# Patient Record
Sex: Female | Born: 1975 | Race: White | Hispanic: No | Marital: Married | Smoking: Former smoker
Health system: Southern US, Community
[De-identification: ages and names within clinical notes are randomized; demographics above are authoritative.]

## PROBLEM LIST (undated history)

## (undated) DIAGNOSIS — G43709 Chronic migraine without aura, not intractable, without status migrainosus: Secondary | ICD-10-CM

## (undated) DIAGNOSIS — M792 Neuralgia and neuritis, unspecified: Secondary | ICD-10-CM

## (undated) DIAGNOSIS — R531 Weakness: Secondary | ICD-10-CM

## (undated) DIAGNOSIS — K644 Residual hemorrhoidal skin tags: Secondary | ICD-10-CM

## (undated) DIAGNOSIS — K219 Gastro-esophageal reflux disease without esophagitis: Secondary | ICD-10-CM

## (undated) DIAGNOSIS — K449 Diaphragmatic hernia without obstruction or gangrene: Secondary | ICD-10-CM

## (undated) DIAGNOSIS — R5383 Other fatigue: Secondary | ICD-10-CM

## (undated) DIAGNOSIS — J329 Chronic sinusitis, unspecified: Secondary | ICD-10-CM

## (undated) DIAGNOSIS — K648 Other hemorrhoids: Secondary | ICD-10-CM

## (undated) DIAGNOSIS — K2970 Gastritis, unspecified, without bleeding: Secondary | ICD-10-CM

## (undated) DIAGNOSIS — F419 Anxiety disorder, unspecified: Secondary | ICD-10-CM

## (undated) DIAGNOSIS — F3340 Major depressive disorder, recurrent, in remission, unspecified: Secondary | ICD-10-CM

## (undated) DIAGNOSIS — D649 Anemia, unspecified: Secondary | ICD-10-CM

## (undated) DIAGNOSIS — M542 Cervicalgia: Secondary | ICD-10-CM

## (undated) HISTORY — DX: Chronic migraine without aura, not intractable, without status migrainosus: G43.709

## (undated) HISTORY — DX: Gastritis, unspecified, without bleeding: K29.70

## (undated) HISTORY — DX: Weakness: R53.1

## (undated) HISTORY — DX: Other hemorrhoids: K64.8

## (undated) HISTORY — DX: Cervicalgia: M54.2

## (undated) HISTORY — DX: Residual hemorrhoidal skin tags: K64.4

## (undated) HISTORY — DX: Diaphragmatic hernia without obstruction or gangrene: K44.9

## (undated) HISTORY — DX: Anemia, unspecified: D64.9

## (undated) HISTORY — PX: DILATION AND CURETTAGE OF UTERUS: SHX78

## (undated) HISTORY — DX: Anxiety disorder, unspecified: F41.9

## (undated) HISTORY — DX: Other fatigue: R53.83

## (undated) HISTORY — PX: LAPAROSCOPY: SHX197

## (undated) HISTORY — PX: TONSILLECTOMY: SUR1361

## (undated) HISTORY — DX: Neuralgia and neuritis, unspecified: M79.2

## (undated) HISTORY — PX: NASAL SINUS SURGERY: SHX719

## (undated) HISTORY — DX: Major depressive disorder, recurrent, in remission, unspecified: F33.40

## (undated) HISTORY — DX: Gastro-esophageal reflux disease without esophagitis: K21.9

## (undated) HISTORY — DX: Chronic sinusitis, unspecified: J32.9

## (undated) HISTORY — PX: ENDOMETRIAL ABLATION: SHX621

## (undated) HISTORY — PX: TUBAL LIGATION: SHX77

## (undated) HISTORY — PX: ANAL SPHINCTEROTOMY: SHX1140

---

## 2003-09-18 ENCOUNTER — Other Ambulatory Visit: Admission: RE | Admit: 2003-09-18 | Discharge: 2003-09-18 | Payer: Self-pay | Admitting: Obstetrics and Gynecology

## 2004-08-08 ENCOUNTER — Ambulatory Visit (HOSPITAL_COMMUNITY): Admission: RE | Admit: 2004-08-08 | Discharge: 2004-08-08 | Payer: Self-pay | Admitting: Obstetrics and Gynecology

## 2004-11-29 ENCOUNTER — Other Ambulatory Visit: Admission: RE | Admit: 2004-11-29 | Discharge: 2004-11-29 | Payer: Self-pay | Admitting: Obstetrics and Gynecology

## 2004-11-30 ENCOUNTER — Ambulatory Visit (HOSPITAL_COMMUNITY): Admission: RE | Admit: 2004-11-30 | Discharge: 2004-11-30 | Payer: Self-pay | Admitting: Obstetrics and Gynecology

## 2006-06-25 ENCOUNTER — Emergency Department (HOSPITAL_COMMUNITY): Admission: EM | Admit: 2006-06-25 | Discharge: 2006-06-25 | Payer: Self-pay | Admitting: Emergency Medicine

## 2006-06-25 ENCOUNTER — Ambulatory Visit: Payer: Self-pay | Admitting: Vascular Surgery

## 2009-08-05 ENCOUNTER — Ambulatory Visit (HOSPITAL_COMMUNITY): Admission: RE | Admit: 2009-08-05 | Discharge: 2009-08-05 | Payer: Self-pay | Admitting: Obstetrics and Gynecology

## 2009-08-20 DEATH — deceased

## 2010-07-21 ENCOUNTER — Encounter (HOSPITAL_COMMUNITY)
Admission: RE | Admit: 2010-07-21 | Discharge: 2010-07-21 | Disposition: A | Discharge status: Home / Self Care | Payer: BC Managed Care – PPO | Source: Ambulatory Visit | Attending: Obstetrics and Gynecology | Admitting: Obstetrics and Gynecology

## 2010-07-21 LAB — RPR: RPR Ser Ql: NONREACTIVE

## 2010-07-21 LAB — CBC
MCH: 32.4 pg (ref 26.0–34.0)
Platelets: 156 10*3/uL (ref 150–400)

## 2010-07-21 LAB — SURGICAL PCR SCREEN: MRSA, PCR: NEGATIVE

## 2010-07-28 ENCOUNTER — Inpatient Hospital Stay (HOSPITAL_COMMUNITY)
Admission: RE | Admit: 2010-07-28 | Discharge: 2010-07-30 | DRG: 371 | Disposition: A | Discharge status: Home / Self Care | Payer: BC Managed Care – PPO | Source: Ambulatory Visit | Attending: Obstetrics and Gynecology | Admitting: Obstetrics and Gynecology

## 2010-07-28 ENCOUNTER — Other Ambulatory Visit: Payer: Self-pay | Admitting: Obstetrics and Gynecology

## 2010-07-28 ENCOUNTER — Inpatient Hospital Stay (HOSPITAL_COMMUNITY): Admission: RE | Admit: 2010-07-28 | Payer: Self-pay | Source: Ambulatory Visit | Admitting: Obstetrics and Gynecology

## 2010-07-28 DIAGNOSIS — Z01812 Encounter for preprocedural laboratory examination: Secondary | ICD-10-CM

## 2010-07-28 DIAGNOSIS — Z302 Encounter for sterilization: Secondary | ICD-10-CM

## 2010-07-28 DIAGNOSIS — Z01818 Encounter for other preprocedural examination: Secondary | ICD-10-CM

## 2010-07-28 DIAGNOSIS — O99892 Other specified diseases and conditions complicating childbirth: Principal | ICD-10-CM | POA: Diagnosis present

## 2010-07-29 LAB — CBC
Hemoglobin: 10.7 g/dL — ABNORMAL LOW (ref 12.0–15.0)
MCH: 31.8 pg (ref 26.0–34.0)
MCHC: 32.9 g/dL (ref 30.0–36.0)
MCV: 96.4 fL (ref 78.0–100.0)
Platelets: 165 10*3/uL (ref 150–400)
RDW: 13.1 % (ref 11.5–15.5)

## 2010-08-01 NOTE — Discharge Summary (Signed)
  [name], Amy Carney                ACCOUNT NO.:  [id number]  MEDICAL RECORD NO.:  [id number]           PATIENT TYPE:  I  LOCATION:  9146                          FACILITY:  [facility]  PHYSICIAN:  Ilda Mori, M.D.   DATE OF BIRTH:  [birth year]  DATE OF ADMISSION:  07/28/2010 DATE OF DISCHARGE:  07/30/2010                              DISCHARGE SUMMARY   FINAL DIAGNOSES: 1. Term pregnancy delivered, primary cesarean section. 2. Previous fourth-degree perineal laceration.  SECONDARY DIAGNOSIS:  None.  PROCEDURE:  Primary low transverse cesarean section.  COMPLICATIONS:  None.  CONDITION ON DISCHARGE:  Improved.  This is a 35 year old gravida 3, para 1-0-1-1 who was admitted at 39 weeks for an elective cesarean section.  The patient had a fourth-degree perineal laceration with her first vaginal delivery and elected to undergo cesarean section to prevent the potential for reinjuring the area.  The patient in addition wanted permanent sterilization.  The patient was brought to the operating by Dr. Carrington Clamp on the day of admission where a primary low transverse cesarean section and bilateral tubal sterilization were performed with the delivery of a female, 8 lbs. 5 ozs., Apgar 9/10.  There were no complications.  The patient's postoperative course was benign without significant fever or anemia.  On the second postoperative day, the patient was tolerating oral intake well.  Her pain was well controlled with ibuprofen, and she was afebrile, vital signs were stable, and she was felt to be ready for discharge.  She was discharged on a regular diet, told to limit activity.  She was asked to continue over-the-counter ibuprofen for pain.  She was given Percocet to take if the pain became more severe. She was also asked to take prenatal vitamins.  She was told to return to the office in 2 weeks for followup evaluation.  Laboratory data revealed an admission hemoglobin of 11.4, white  count of 9000.  Postoperatively, her hemoglobin was 10.7 with a white count of 8.8.     Ilda Mori, M.D.     RK/MEDQ  D:  07/30/2010  T:  07/30/2010  Job:  [id number]  Electronically Signed by Ilda Mori M.D. on 08/01/2010 12:11:19 PM

## 2010-08-14 LAB — CBC
HCT: 36.9 % (ref 36.0–46.0)
MCHC: 33.8 g/dL (ref 30.0–36.0)
MCV: 95.6 fL (ref 78.0–100.0)
RBC: 3.85 MIL/uL — ABNORMAL LOW (ref 3.87–5.11)
WBC: 4.4 10*3/uL (ref 4.0–10.5)

## 2010-08-16 NOTE — Op Note (Signed)
Amy Carney, ROBBEN NO.:  [id number]  MEDICAL RECORD NO.:  [id number]           PATIENT TYPE:  I  LOCATION:  9146                          FACILITY:  [facility]  PHYSICIAN:  Carrington Clamp, M.D. DATE OF BIRTH:  12-15-1975  DATE OF PROCEDURE:  07/28/2010 DATE OF DISCHARGE:                              OPERATIVE REPORT   PREOPERATIVE DIAGNOSES:  Previous vaginal delivery with a fourth degree and a postpartum revision and repair of fourth-degree tear, desires primary cesarean section to avoid further vaginal complications and multiparous, desires permanent sterility.  POSTOPERATIVE DIAGNOSES:  Previous vaginal delivery with a fourth degree and a postpartum revision and repair of fourth-degree tear, desires primary cesarean section to avoid further vaginal complications and multiparous, desires permanent sterility.  PROCEDURE:  Primary low transverse cesarean section with bilateral tubal ligation.  SURGEON:  Carrington Clamp, MD  ASSISTANT:  Luvenia Redden, MD  ANESTHESIA:  Spinal.  FINDINGS:  Girl infant, Apgars 9 and 10, weight 8 pounds 5 ounces, vertex presentation.  There were normal tubes, ovaries, uterus seen.  SPECIMENS:  Right and left tubal segments to pathology.  ESTIMATED BLOOD LOSS:  300 mL.  IV FLUIDS:  2000 mL.  URINE OUTPUT:  150 mL.  COMPLICATIONS:  None.  MEDICATIONS:  Ancef and Pitocin.  COUNTS:  Correct x3.  TECHNIQUE:  After adequate spinal anesthesia was achieved, the patient was prepped and draped in usual sterile fashion, dorsal supine position with leftward tilt.  A Pfannenstiel skin incision was made with a scalpel carried down to the fascia with Bovie cautery.  The fascia was incised in midline and with the scalpel and then carried in transverse curvilinear manner with the Mayo scissors.  The fascia was reflected superior and inferior from the rectus muscles and the rectus muscles were split in the midline.  The  bowel-free portion of peritoneum was then entered into and bluntly and the peritoneum opened in the superior inferior manner with good visualization of bowel and bladder.  The [employer] was then placed.  The vesicouterine fascia was then tented up and incised in a transverse curvilinear manner with the Metzenbaum scissors.  The bladder flap was retracted bluntly downward and out of the field of dissection.  A 2-cm incision was made in the upper portion of lower uterine segment until clear fluid was noted on to entry to the amnion.  The incision was extended in a transverse curvilinear manner bluntly.  The baby was identified in vertex presentation, delivered without complication.  The baby was bulb suctioned.  The cord was clamped and cut, and the baby was handed to awaiting pediatrics.  The placenta was then delivered manually and the uterus cleared of all debris with a wet lap.  The uterine incision then closed with a running lock stitch of zero Monocryl.  An imbricating layer of zero Monocryl was performed as well.  The uterine incision was hemostatic, and attention was then turned to the tubes, the left tube was tented up with a Babcock retractor.  A 2-cm incision was made in the avascular portion of the mesosalpinx.  Two freehand ties  were passed on each side to create an intervening segment of 2 cm of the tube.  Each was tied down.  The tubal segment was then excised and sent to pathology.  The same exact procedure was performed on the right-hand side and hemostasis was achieved.  The uterine incision was reinspected as well as the tubal ligation sites and everything was hemostatic.  Irrigation was performed and the [employer] was removed.  The peritoneum was then closed with running stitch of 2-0 Vicryl.  This incorporated the rectus muscles as separate layer.  The subcutaneous tissue was rendered hemostatic with Bovie cautery and irrigation.  The subcutaneous  tissue was closed with several interrupted stitches of 2-0 plain gut.  The skin was closed with staples.  The patient tolerated the procedure well and was returned to recovery stable condition.     Carrington Clamp, M.D.     MH/MEDQ  D:  07/28/2010  T:  07/29/2010  Job:  [id number]  Electronically Signed by Carrington Clamp MD on 08/16/2010 07:15:35 PM

## 2010-09-08 ENCOUNTER — Other Ambulatory Visit: Payer: Self-pay | Admitting: Obstetrics and Gynecology

## 2010-10-07 NOTE — Op Note (Signed)
Amy Carney, Amy Carney                ACCOUNT NO.:  [id number]   MEDICAL RECORD NO.:  [id number]          PATIENT TYPE:  AMB   LOCATION:  SDC                           [facility]ILITY:  [facility]   PHYSICIAN:  Carrington Clamp, M.D. DATE OF BIRTH:  10-Feb-1976   DATE OF PROCEDURE:  08/08/2004  DATE OF DISCHARGE:                                 OPERATIVE REPORT   PREOPERATIVE DIAGNOSIS:  Left lower quadrant pain.   POSTOPERATIVE DIAGNOSIS:  Adhesions.   PROCEDURE:  Diagnostic laparoscopy and lysis of adhesions.   SURGEON:  Carrington Clamp, M.D.   ASSISTANT:  None.   ANESTHESIA:  General.   SPECIMENS:  None.   ESTIMATED BLOOD LOSS:  None.   IV FLUIDS:  1000 mL.   URINE OUTPUT:  Not measured.   COMPLICATIONS:  None.   FINDINGS:  Normal pelvis and abdomen.  The uterus, although slightly boggy,  was normal in shape and appearance without fibroids.  The tubes, ovaries and  cul-de-sac were all clear of any adhesions or disease processes.  The  appendix was completely normal in appearance.  The liver edge and pelvic  peritoneum were completely normal as well.  There was no evidence of  hernias.  There was a minimal engorgement, injection of the pelvis on the  left-hand side.  There was an adhesion of the sigmoid to the anterior  abdominal wall close to the external iliac artery and vein.   MEDICATIONS:  Interceed.   COUNTS:  Correct x3.   TECHNIQUE:  After adequate general anesthesia was achieved, the patient was  prepped and draped in the usual sterile fashion in the dorsal lithotomy  position.  The speculum was placed in the vagina and a uterine manipulator  placed into the uterus.  The bladder was drained with a red rubber catheter  during the procedure.  Attention was then turned to the abdomen, where a 2  cm infraumbilical incision was made with a scalpel and the Veress needle  passed into the abdomen without aspiration of bowel contents or blood.  The  10 mm trocar was then placed  into the abdomen after the abdomen had been  insufflated and a 5 mm trocar placed suprapubically under direct  visualization of the camera.  The above findings were noted and a left-  handed peritoneal adhesion was tented up away from any of the bowel or  vessel structures and carefully cauterized.  Scissors were then used to take  down the cauterized portion of the adhesions.  Once the adhesion had been  cleared off the pelvic sidewall, a piece of Interceed was introduced and  placed over the denuded are to prevent re-adhesion.  All instruments were  then withdrawn from the abdomen, the abdomen desufflated.  The 10 mm trocar  site fascia was then closed with a figure-of-eight stitch of 2-0 Vicryl, the  5 mm skin site was closed with a superficial stitch of 3-0 Vicryl Rapide.  The skin incision was then closed with Dermabond.  All instruments were  withdrawn from the vagina.  The patient was returned to the recovery room in  stable condition.      MH/MEDQ  D:  08/08/2004  T:  08/08/2004  Job:  [id number]

## 2012-09-27 ENCOUNTER — Other Ambulatory Visit: Payer: Self-pay | Admitting: Obstetrics and Gynecology

## 2012-09-27 DIAGNOSIS — N644 Mastodynia: Secondary | ICD-10-CM

## 2012-10-10 ENCOUNTER — Ambulatory Visit
Admission: RE | Admit: 2012-10-10 | Discharge: 2012-10-10 | Disposition: A | Discharge status: Home / Self Care | Payer: BC Managed Care – PPO | Source: Ambulatory Visit | Attending: Obstetrics and Gynecology | Admitting: Obstetrics and Gynecology

## 2012-10-10 DIAGNOSIS — N644 Mastodynia: Secondary | ICD-10-CM

## 2015-11-02 ENCOUNTER — Encounter: Payer: Self-pay | Admitting: Internal Medicine

## 2015-11-02 NOTE — Telephone Encounter (Signed)
A user error has taken place.

## 2015-11-17 ENCOUNTER — Encounter: Payer: Self-pay | Admitting: Internal Medicine

## 2015-11-17 ENCOUNTER — Other Ambulatory Visit (INDEPENDENT_AMBULATORY_CARE_PROVIDER_SITE_OTHER): Payer: BLUE CROSS/BLUE SHIELD

## 2015-11-17 ENCOUNTER — Ambulatory Visit (INDEPENDENT_AMBULATORY_CARE_PROVIDER_SITE_OTHER): Payer: BLUE CROSS/BLUE SHIELD | Admitting: Internal Medicine

## 2015-11-17 VITALS — BP 108/66 | HR 62 | Ht 72.0 in | Wt 170.0 lb

## 2015-11-17 DIAGNOSIS — K219 Gastro-esophageal reflux disease without esophagitis: Secondary | ICD-10-CM

## 2015-11-17 DIAGNOSIS — R1013 Epigastric pain: Secondary | ICD-10-CM

## 2015-11-17 DIAGNOSIS — K625 Hemorrhage of anus and rectum: Secondary | ICD-10-CM | POA: Diagnosis not present

## 2015-11-17 LAB — IGA: IGA: 90 mg/dL (ref 68–378)

## 2015-11-17 MED ORDER — NA SULFATE-K SULFATE-MG SULF 17.5-3.13-1.6 GM/177ML PO SOLN: ORAL | Status: DC

## 2015-11-17 NOTE — Patient Instructions (Signed)
You have been scheduled for an endoscopy and colonoscopy. Please follow the written instructions given to you at your visit today. Please pick up your prep supplies at the pharmacy within the next 1-3 days. If you use inhalers (even only as needed), please bring them with you on the day of your procedure. Your physician has requested that you go to www.startemmi.com and enter the access code given to you at your visit today. This web site gives a general overview about your procedure. However, you should still follow specific instructions given to you by our office regarding your preparation for the procedure.  Your physician has requested that you go to the basement for the following lab work before leaving today: Celiac testing  If you are age 40 or older, your body mass index should be between 23-30. Your Body mass index is 23.05 kg/(m^2). If this is out of the aforementioned range listed, please consider follow up with your Primary Care Provider.  If you are age 40 or younger, your body mass index should be between 19-25. Your Body mass index is 23.05 kg/(m^2). If this is out of the aformentioned range listed, please consider follow up with your Primary Care Provider.

## 2015-11-17 NOTE — Progress Notes (Signed)
Patient ID: Amy Carney, female   DOB: 05/08/1976, 40 y.o.   MRN: [id number] HPI: Amy Carney is a 40 year old female with a past medical history of GERD who is seen to evaluate worsening GERD symptoms and intermittent rectal bleeding. She is here alone today. She reports a long-standing history of heartburn and reflux disease. She's been taking pantoprazole 40 mg daily. She's previously evaluated 5 years or longer ago by Dr. Chales AbrahamsGupta.  She had an upper endoscopy 5 or more years ago which was reportedly normal. She's been having epigastric abdominal pressure which can become quite severe. This can radiate up into her chest and throat. At times it radiates into her left chest. No specific trigger the food seems to trigger it at times. No nausea, vomiting or dysphagia. When the pressure is most intense it can last for days. She states she increases pantoprazole to 40 mg twice a day and add Zantac 150 mg 1-2 times a day to help "settle down" the symptoms. Over time this seems to help. Symptoms are not exertional and not associated with dyspnea. She reports bowel movements as regular though she has seen intermittent red blood in her stool and on the toilet tissue. This is in general painless. She denies significant constipation or diarrhea. She denies mid and lower abdominal pain. She does state that during vaginal birth of her first child in 2004 she had a perineal tear which extended near the anal sphincter. This required repair. Her second childbirth was through cesarean section. Family history notable for colon cancer in her maternal great-grandmother. She also has a paternal grandmother with Aron's esophagus. Other workup through primary care has included abdominal ultrasound and HIDA scan in August 2016 which were reportedly normal.  She has [facility] Sherol [facility]ade[facility]abrina Campbell[facility] [place] She sees Richardson DoppJoyce Patram-Brown, NP for primary care [facility].  Past  Medical History  Diagnosis Date  . GERD (gastroesophageal reflux disease)     Past Surgical History  Procedure Laterality Date  . Cesarean section    . Tonsillectomy    . Dilation and curettage of uterus    . Laparoscopy      No outpatient prescriptions prior to visit.   No facility-administered medications prior to visit.    Allergies  Allergen Reactions  . Penicillins Hives and Rash  . Sulfa Antibiotics Hives and Rash    Family History  Problem Relation Age of Onset  . Colon cancer      Great Maternal Grandmother  . Barrett's esophagus Paternal Grandmother     Social History  Substance Use Topics  . Smoking status: Former Games developermoker  . Smokeless tobacco: Never Used  . Alcohol Use: No    ROS: As per history of present illness, otherwise negative  BP 108/66 mmHg  Pulse 62  Ht 6' (1.829 m)  Wt 170 lb (77.111 kg)  BMI 23.05 kg/m2 Constitutional: Well-developed and well-nourished. No distress. HEENT: Normocephalic and atraumatic. Oropharynx is clear and moist. No oropharyngeal exudate. Conjunctivae are normal.  No scleral icterus. Neck: Neck supple. Trachea midline. Cardiovascular: Normal rate, regular rhythm and intact distal pulses. No M/R/G Pulmonary/chest: Effort normal and breath sounds normal. No wheezing, rales or rhonchi. Abdominal: Soft, Mild epigastric tenderness without rebound or guarding, nondistended. Bowel sounds active throughout. There are no masses palpable. No hepatosplenomegaly. Extremities: no clubbing, cyanosis, or edema Lymphadenopathy: No cervical adenopathy noted. Neurological: Alert and oriented to person place and time. Skin: Skin is warm  and dry. No rashes noted. Psychiatric: Normal mood and affect. Behavior is normal.  ASSESSMENT/PLAN: 40 year old female with a past medical history of GERD who is seen to evaluate worsening GERD symptoms and intermittent rectal bleeding.  1. GERD with epigastric pain -- her symptoms are consistent with  gastroesophageal reflux which are no longer controlled by once daily PPI. The epigastric pressure is new symptom for her. It seems to respond when she doubles the dose for PPI. We have discussed changing therapy to a longer acting PPI such as Dexilant. Given the change in her symptom I recommended repeat upper endoscopy. We discussed the risks, benefits and alternatives and she wishes to proceed. Check celiac panel today. I have advise that she continue pantoprazole 40 mg once daily for now and add ranitidine on a scheduled daily basis 150 mg in the evening.  2. Intermittent rectal bleeding -- colonoscopy recommended to evaluate further. We discussed the risks, benefits and alternatives and she wishes to proceed.

## 2015-11-18 LAB — TISSUE TRANSGLUTAMINASE, IGA: Tissue Transglutaminase Ab, IgA: 1 U/mL (ref <4)

## 2015-11-30 ENCOUNTER — Encounter: Payer: Self-pay | Admitting: Internal Medicine

## 2015-12-09 ENCOUNTER — Telehealth: Payer: Self-pay | Admitting: Internal Medicine

## 2015-12-09 ENCOUNTER — Encounter: Payer: Self-pay | Admitting: Internal Medicine

## 2015-12-09 ENCOUNTER — Ambulatory Visit (AMBULATORY_SURGERY_CENTER): Payer: BLUE CROSS/BLUE SHIELD | Admitting: Internal Medicine

## 2015-12-09 VITALS — BP 116/67 | HR 63 | Temp 97.8°F | Resp 12 | Ht 72.0 in | Wt 170.0 lb

## 2015-12-09 DIAGNOSIS — K219 Gastro-esophageal reflux disease without esophagitis: Secondary | ICD-10-CM

## 2015-12-09 DIAGNOSIS — R1013 Epigastric pain: Secondary | ICD-10-CM

## 2015-12-09 DIAGNOSIS — K625 Hemorrhage of anus and rectum: Secondary | ICD-10-CM | POA: Diagnosis not present

## 2015-12-09 DIAGNOSIS — K2950 Unspecified chronic gastritis without bleeding: Secondary | ICD-10-CM | POA: Diagnosis not present

## 2015-12-09 MED ORDER — SODIUM CHLORIDE 0.9 % IV SOLN: 500.0000 mL | INTRAVENOUS | Status: DC

## 2015-12-09 NOTE — Progress Notes (Signed)
Report to PACU, RN, vss, BBS= Clear.  

## 2015-12-09 NOTE — Op Note (Signed)
Amy Carney Procedure Date: 12/09/2015 1:51 PM MRN: [id number] Endoscopist: Beverley FiedlerJay M Tezra Mahr , MD Age: 40 Referring MD:  Date of Birth: 09/20/1975 Gender: Female Account #: [id number] Procedure:                Colonoscopy Indications:              Rectal bleeding Medicines:                Monitored Anesthesia Care Procedure:                Pre-Anesthesia Assessment:                           - Prior to the procedure, a History and Physical                            was performed, and patient medications and                            allergies were reviewed. The patient's tolerance of                            previous anesthesia was also reviewed. The risks                            and benefits of the procedure and the sedation                            options and risks were discussed with the patient.                            All questions were answered, and informed consent                            was obtained. Prior Anticoagulants: The patient has                            taken no previous anticoagulant or antiplatelet                            agents. ASA Grade Assessment: II - A patient with                            mild systemic disease. After reviewing the risks                            and benefits, the patient was deemed in                            satisfactory condition to undergo the procedure.                           After obtaining informed consent, the colonoscope  was passed under direct vision. Throughout the                            procedure, the patient's blood pressure, pulse, and                            oxygen saturations were monitored continuously. The                            Model PCF-H190DL [id number]) scope was introduced                            through the anus and advanced to the the cecum,                            identified by appendiceal orifice and ileocecal                             valve. The colonoscopy was performed without                            difficulty. The patient tolerated the procedure                            well. The quality of the bowel preparation was                            excellent. The ileocecal valve, appendiceal                            orifice, and rectum were photographed. Scope In: 2:08:32 PM Scope Out: 2:20:07 PM Scope Withdrawal Time: 0 hours 8 minutes 59 seconds  Total Procedure Duration: 0 hours 11 minutes 35 seconds  Findings:                 The perianal exam findings include a skin tag.                           The colon (entire examined portion) appeared normal.                           Internal hemorrhoids were found during                            retroflexion. The hemorrhoids were small. Complications:            No immediate complications. Estimated Blood Loss:     Estimated blood loss: none. Impression:               - Perianal skin tag found on perianal exam.                           - The entire examined colon is normal.                           -  Small internal hemorrhoids.                           - No specimens collected. Recommendation:           - Patient has a contact number available for                            emergencies. The signs and symptoms of potential                            delayed complications were discussed with the                            patient. Return to normal activities tomorrow.                            Written discharge instructions were provided to the                            patient.                           - Resume previous diet.                           - Continue present medications.                           - Repeat colonoscopy at age 58 for screening                            purposes.                           - If recurrent rectal bleeding on a regular basis                            hemorrhoidal banding would be a treatment  option. Beverley Fiedler, MD 12/09/2015 2:29:43 PM This report has been signed electronically. CC Letter to:             Richardson Dopp, NP

## 2015-12-09 NOTE — Telephone Encounter (Signed)
Returned patient phone call. She is scheduled for colon at 2 pm today and wanted to know if she could take Tylenol Sinus Cold and nasal spray for sinus headache. Denies fever. Advised okay to take as long as in by 11 am.

## 2015-12-09 NOTE — Progress Notes (Signed)
Called to room to assist during endoscopic procedure.  Patient ID and intended procedure confirmed with present staff. Received instructions for my participation in the procedure from the performing physician.  

## 2015-12-09 NOTE — Op Note (Signed)
[facility] Patient Name: Amy DaftJenny Carney Procedure Date: 12/09/2015 1:52 PM MRN: [id number] Endoscopist: Beverley FiedlerJay M Shakiah Wester , MD Age: [age] Referring MD:  Date of Birth: 02/20/1976 Gender: Female Account #: [id number] Procedure:                Upper GI endoscopy Indications:              Epigastric abdominal pain, Gastro-esophageal reflux                            disease Medicines:                Monitored Anesthesia Care Procedure:                Pre-Anesthesia Assessment:                           - Prior to the procedure, a History and Physical                            was performed, and patient medications and                            allergies were reviewed. The patient's tolerance of                            previous anesthesia was also reviewed. The risks                            and benefits of the procedure and the sedation                            options and risks were discussed with the patient.                            All questions were answered, and informed consent                            was obtained. Prior Anticoagulants: The patient has                            taken no previous anticoagulant or antiplatelet                            agents. ASA Grade Assessment: II - A patient with                            mild systemic disease. After reviewing the risks                            and benefits, the patient was deemed in                            satisfactory condition to undergo the procedure.  After obtaining informed consent, the endoscope was                            passed under direct vision. Throughout the                            procedure, the patient's blood pressure, pulse, and                            oxygen saturations were monitored continuously. The                            Model GIF-[id number]) scope was introduced                            through the mouth, and advanced to the second  part                            of duodenum. The upper GI endoscopy was                            accomplished without difficulty. The patient                            tolerated the procedure well. Scope In: Scope Out: Findings:                 The examined esophagus was normal.                           A small hiatal hernia was present on retroflexed                            views in the cardia/fundus.                           Localized mild inflammation characterized by                            erythema was found on the greater curvature of the                            gastric body. Biopsies were taken with a cold                            forceps for histology and Helicobacter pylori                            testing (body, antrum, and incisura including                            erythematous mucosa).                           The examined duodenum was normal. Complications:  No immediate complications. Estimated Blood Loss:     Estimated blood loss was minimal. Impression:               - Normal esophagus.                           - Small hiatal hernia.                           - Gastritis. Biopsied.                           - Normal examined duodenum. Recommendation:           - Patient has a contact number available for                            emergencies. The signs and symptoms of potential                            delayed complications were discussed with the                            patient. Return to normal activities tomorrow.                            Written discharge instructions were provided to the                            patient.                           - Resume previous diet.                           - Continue present medications.                           - Await pathology results.                           - GERD diet and reflux precautions Beverley Fiedler, MD 12/09/2015 2:25:30 PM This report has been signed  electronically. CC Letter to:             Richardson Dopp, NP

## 2015-12-09 NOTE — Patient Instructions (Signed)

## 2015-12-10 ENCOUNTER — Telehealth: Payer: Self-pay | Admitting: *Deleted

## 2015-12-10 NOTE — Telephone Encounter (Signed)
  Follow up Call-  Call back number 12/09/2015  Post procedure Call Back phone  # [phone]  Permission to leave phone message Yes     Patient questions:  Message left to call us if necessary.

## 2015-12-15 ENCOUNTER — Encounter: Payer: Self-pay | Admitting: Internal Medicine

## 2016-02-01 ENCOUNTER — Encounter: Payer: Self-pay | Admitting: *Deleted

## 2016-02-03 ENCOUNTER — Other Ambulatory Visit: Payer: Self-pay | Admitting: Obstetrics and Gynecology

## 2016-02-03 DIAGNOSIS — N6320 Unspecified lump in the left breast, unspecified quadrant: Secondary | ICD-10-CM

## 2016-02-21 ENCOUNTER — Ambulatory Visit: Payer: BLUE CROSS/BLUE SHIELD | Admitting: Internal Medicine

## 2016-03-02 ENCOUNTER — Ambulatory Visit
Admission: RE | Admit: 2016-03-02 | Discharge: 2016-03-02 | Disposition: A | Discharge status: Home / Self Care | Payer: BLUE CROSS/BLUE SHIELD | Source: Ambulatory Visit | Attending: Obstetrics and Gynecology | Admitting: Obstetrics and Gynecology

## 2016-03-02 ENCOUNTER — Other Ambulatory Visit: Payer: Self-pay | Admitting: Obstetrics and Gynecology

## 2016-03-02 DIAGNOSIS — N6320 Unspecified lump in the left breast, unspecified quadrant: Secondary | ICD-10-CM

## 2016-03-02 DIAGNOSIS — R928 Other abnormal and inconclusive findings on diagnostic imaging of breast: Secondary | ICD-10-CM

## 2016-03-02 DIAGNOSIS — N6310 Unspecified lump in the right breast, unspecified quadrant: Secondary | ICD-10-CM

## 2016-03-07 ENCOUNTER — Ambulatory Visit
Admission: RE | Admit: 2016-03-07 | Discharge: 2016-03-07 | Disposition: A | Discharge status: Home / Self Care | Payer: BLUE CROSS/BLUE SHIELD | Source: Ambulatory Visit | Attending: Obstetrics and Gynecology | Admitting: Obstetrics and Gynecology

## 2016-03-07 DIAGNOSIS — R928 Other abnormal and inconclusive findings on diagnostic imaging of breast: Secondary | ICD-10-CM

## 2016-03-07 HISTORY — PX: BREAST BIOPSY: SHX20

## 2016-06-28 ENCOUNTER — Other Ambulatory Visit: Payer: Self-pay | Admitting: Specialist

## 2016-06-28 DIAGNOSIS — M792 Neuralgia and neuritis, unspecified: Secondary | ICD-10-CM

## 2016-06-28 DIAGNOSIS — G43709 Chronic migraine without aura, not intractable, without status migrainosus: Secondary | ICD-10-CM

## 2016-07-06 ENCOUNTER — Ambulatory Visit
Admission: RE | Admit: 2016-07-06 | Discharge: 2016-07-06 | Disposition: A | Discharge status: Home / Self Care | Payer: BLUE CROSS/BLUE SHIELD | Source: Ambulatory Visit | Attending: Specialist | Admitting: Specialist

## 2016-07-06 ENCOUNTER — Other Ambulatory Visit: Payer: Self-pay | Admitting: Specialist

## 2016-07-06 DIAGNOSIS — G43709 Chronic migraine without aura, not intractable, without status migrainosus: Secondary | ICD-10-CM

## 2016-07-10 ENCOUNTER — Ambulatory Visit
Admission: RE | Admit: 2016-07-10 | Discharge: 2016-07-10 | Disposition: A | Discharge status: Home / Self Care | Payer: BLUE CROSS/BLUE SHIELD | Source: Ambulatory Visit | Attending: Specialist | Admitting: Specialist

## 2016-07-10 ENCOUNTER — Other Ambulatory Visit: Payer: BLUE CROSS/BLUE SHIELD

## 2016-07-10 DIAGNOSIS — M792 Neuralgia and neuritis, unspecified: Secondary | ICD-10-CM

## 2016-07-10 DIAGNOSIS — G43709 Chronic migraine without aura, not intractable, without status migrainosus: Secondary | ICD-10-CM

## 2016-07-10 MED ORDER — GADOBENATE DIMEGLUMINE 529 MG/ML IV SOLN
15.0000 mL | Freq: Once | INTRAVENOUS | Status: AC | PRN
  Administered 2016-07-10: 15 mL via INTRAVENOUS

## 2016-07-28 ENCOUNTER — Ambulatory Visit (INDEPENDENT_AMBULATORY_CARE_PROVIDER_SITE_OTHER): Payer: BLUE CROSS/BLUE SHIELD | Admitting: Neurology

## 2016-07-28 ENCOUNTER — Encounter: Payer: Self-pay | Admitting: Neurology

## 2016-07-28 VITALS — BP 113/71 | HR 76 | Ht 72.0 in | Wt 175.0 lb

## 2016-07-28 DIAGNOSIS — IMO0001 Reserved for inherently not codable concepts without codable children: Secondary | ICD-10-CM

## 2016-07-28 DIAGNOSIS — R531 Weakness: Secondary | ICD-10-CM | POA: Diagnosis not present

## 2016-07-28 DIAGNOSIS — G43709 Chronic migraine without aura, not intractable, without status migrainosus: Secondary | ICD-10-CM

## 2016-07-28 DIAGNOSIS — R209 Unspecified disturbances of skin sensation: Secondary | ICD-10-CM

## 2016-07-28 MED ORDER — TOPIRAMATE 25 MG PO TABS: 50.0000 mg | ORAL_TABLET | Freq: Every day | ORAL | 6 refills | Status: DC

## 2016-07-28 MED ORDER — CYCLOBENZAPRINE HCL 10 MG PO TABS: 10.0000 mg | ORAL_TABLET | Freq: Every day | ORAL | 6 refills | Status: DC

## 2016-07-28 NOTE — Progress Notes (Signed)
[name]    Provider:  Dr Lucia Gaskins Referring Provider: Rhea Bleacher* Primary Care Physician:  Rhea Bleacher*  CC:  Migraines and neck pain  HPI:  Amy Carney is a 41 y.o. female here as a referral from Dr. Sylvan Cheese for right-sided numbness and weakness and tingling. PMHx anxiety and attacks, migraine. In early January she had to be on Levaquin for sinus infection. She has had joint pain in the past due to Levaquin. On day 4 of the antibiotic she noted right-sided tingling and numbness the entire right side ("could split it right down the middle") on the right. Then she had difficulty breathing maybe due to being uncomfortable, she feels like the right side of her body feels restless. She has chronic tingling from the knees down. She has migraines and headaches and feels lhis comes from tension in the neck, she goes to massage which helps, tension makes it worse. She goes to a [place]. Recently she has daily headaches for 4-6 months. She has tightness in the neck as prodrome, she has vision changes, nausea, right side of the head with throbbing and pressure. Can last for days. She uses excedrin migraine. The right sided symptoms do not coincide with the migraines. It is continuous right-sided symptoms but waxes and wanes. It can be severely uncomfortable. Nothing makes the right-sided symptoms better. She is seeing a rheumatologist as her ANA was positive. She has weakness on the right side and loss of muscle mass moreso on the right. She has nerve pain below the knees, she feels burnign in the feet occasionally. No other focal neurologic deficits, associated symptoms, inciting events or modifiable factors.  Reviewed notes, labs and imaging from outside physicians, which showed:  Personally reviewed images of the brain and cervical spine and agree with the following:  IMPRESSION: 1. Normal MRI of the brain.  No evidence of demyelinating  disease. 2. Mild cervical degenerative disc disease without spinal canal or neural foraminal stenosis  Reviewed notes from primary care. She has a feeling of numbness in the right arm and right lower extremity and the left side she has no numbness she has some pain to the left arm and the muscle region that actually she can push on it and re-create it does not seem to be related to her chest and her anginal episode. She does have bilateral neck discomfort and she has a headache 3-4 days out of the week sometimes there is a migraine sometimes there is just a headache that seems to be coming from the neck movement of the head. She is easily upset about all of this and she is crying and frustrated over the fact that she is just tired feeling bad. Last year she had some similar issues more malaise that we did last for which really did not show anything significant for her is a strong family history of coronary disease which she felt that the fear as well as atrial fibrillation medication she is having none of the symptoms as well. She describes muscle fatigue and is that she is not dropping objects and she feels like she is just not able to sometimes even hold herself up which would normally today she has no speech abnormalities nor has there been loss of vision though she can tell a change in her vision that could just be age-related. No rashes.   Review of Systems: Patient complains of symptoms per HPI as well as the following symptoms: no CP, no SOB. Pertinent  negatives per HPI. All others negative.   Social History   Social History  . Marital status: Married    Spouse name: N/A  . Number of children: 2  . 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 Smoking status: Former Smoker    Quit date: 2003  . Smokeless tobacco: Never Used  . Alcohol use 0.0 oz/week     Comment: Social  . Drug use: No  . Sexual activity: Not on file     Comment:  Married   Other Topics Concern  . Not on file   Social History Narrative   Lives at home w/ her husband and children   Right-handed   Caffeine: 2-3 cups per day       Family History  Problem Relation Age of Onset  . Colon cancer      Great Maternal Grandmother  . Barrett's esophagus Paternal Grandmother   . Osteoporosis Paternal Grandmother   . Migraines Mother   . Skin cancer Mother   . Hypertension Maternal Grandmother   . Lymphoma Maternal Grandfather   . Stroke Paternal Grandfather     Past Medical History:  Diagnosis Date  . Anemia   . Anxiety   . Bilateral neck pain   . Chronic migraine without aura without status migrainosus, not intractable   . Fatigue   . Gastritis   . GERD (gastroesophageal reflux disease)   . Hiatal hernia   . Internal hemorrhoids   . Neuralgia   . Recurrent major depression in remission (HCC)   . Sinusitis   . Skin tag of perianal region   . Weakness     Past Surgical History:  Procedure Laterality Date  . ANAL SPHINCTEROTOMY    . CESAREAN SECTION    . DILATION AND CURETTAGE OF UTERUS    . ENDOMETRIAL ABLATION    . LAPAROSCOPY    . NASAL SINUS SURGERY    . TONSILLECTOMY    . TUBAL LIGATION      Current Outpatient Prescriptions  Medication Sig Dispense Refill  . aspirin-acetaminophen-caffeine (EXCEDRIN MIGRAINE) 250-250-65 MG tablet Take by mouth every 8 (eight) hours as needed for headache.    . ibuprofen (ADVIL,MOTRIN) 200 MG tablet Take 200 mg by mouth every 8 (eight) hours as needed.    . metoCLOPramide (REGLAN) 10 MG tablet Take by mouth as needed.     . pantoprazole (PROTONIX) 40 MG tablet Take 40 mg by mouth.    . Probiotic Product (PROBIOTIC DAILY PO) Take 1 capsule by mouth daily.    . ranitidine (ZANTAC) 150 MG capsule Take 150 mg by mouth as needed for heartburn. Reported on 12/09/2015    . sucralfate (CARAFATE) 1 g tablet Take by mouth as needed.     . cyclobenzaprine (FLEXERIL) 10 MG tablet Take 1 tablet (10 mg  total) by mouth at bedtime. 30 tablet 6  . topiramate (TOPAMAX) 25 MG tablet Take 2 tablets (50 mg total) by mouth at bedtime. 60 tablet 6   No current facility-administered medications for this visit.     Allergies as of 07/28/2016 - Review Complete 07/28/2016  Allergen Reaction Noted  . Penicillins Hives and Rash 11/17/2015  . Sulfa antibiotics Hives and Rash 11/17/2015    Vitals: BP 113/71   Pulse 76   Ht 6' (1.829 m)   Wt 175 lb (79.4 kg)   BMI 23.73 kg/m  Last Weight:  Wt Readings  from Last 1 Encounters:  07/28/16 175 lb (79.4 kg)   Last Height:   Ht Readings from Last 1 Encounters:  07/28/16 6' (1.829 m)    Physical exam: Exam: Gen: NAD, conversant, well nourised, well groomed                     CV: RRR, no MRG. No Carotid Bruits. No peripheral edema, warm, nontender Eyes: Conjunctivae clear without exudates or hemorrhage  Neuro: Detailed Neurologic Exam  Speech:    Speech is normal; fluent and spontaneous with normal comprehension.  Cognition:    The patient is oriented to person, place, and time;     recent and remote memory intact;     language fluent;     normal attention, concentration,     fund of knowledge Cranial Nerves:    The pupils are equal, round, and reactive to light. The fundi are normal and spontaneous venous pulsations are present. Visual fields are full to finger confrontation. Extraocular movements are intact. Trigeminal sensation is intact and the muscles of mastication are normal. The face is symmetric. The palate elevates in the midline. Hearing intact. Voice is normal. Shoulder shrug is normal. The tongue has normal motion without fasciculations.   Coordination:    Normal finger to nose and heel to shin. Normal rapid alternating movements.   Gait:    Heel-toe and tandem gait are normal.   Motor Observation:    No asymmetry, no atrophy, and no involuntary movements noted. Tone:    Normal muscle tone.    Posture:    Posture is  normal. normal erect    Strength: Mild weakness right upper and lower extremities 5-/5 otherwise strength is V/V in the upper and lower limbs.      Sensation: Slight decrease to pinprick in right arm and right leg as compared to the left arm and left leg. Also distal decrease to pinprick and temperature in a gradient fashion in the distal lower extremities bilaterally and symmetric. Intact vibration.     Reflex Exam:  DTR's:    Absent AJs otherwise deep tendon reflexes in the upper and lower extremities are hyporeflexic but symmetric bilaterally.   Toes:    The toes are equivocal bilaterally.   Clonus:    Clonus is absent.      Assessment/Plan:  41 year old patient with chronic migraines, right-sided numbness and tingling(face, thorax/abd/pelvis,leg and arm "straight down the middle"). MRI brain was normal. May be migrainous phenomenon as no other etiology suspected. There is some anxiety which may be contributory.   Emg/NCS of the right arm and leg Request labs from pcp Request labs from Rheumatology (Dr. Anson [place]) Will order any additional labs as needed  Start Topiramate for migraine prevention Flexeril at night for muscle pain cervical area  Discussed: To prevent or relieve headaches, try the following: Cool Compress. Lie down and place a cool compress on your head.  Avoid headache triggers. If certain foods or odors seem to have triggered your migraines in the past, avoid them. A headache diary might help you identify triggers.  Include physical activity in your daily routine. Try a daily walk or other moderate aerobic exercise.  Manage stress. Find healthy ways to cope with the stressors, such as delegating tasks on your to-do list.  Practice relaxation techniques. Try deep breathing, yoga, massage and visualization.  Eat regularly. Eating regularly scheduled meals and maintaining a healthy diet might help prevent headaches. Also, drink plenty of fluids.  Follow a regular  sleep schedule. Sleep deprivation might contribute to headaches Consider biofeedback. With this mind-body technique, you learn to control certain bodily functions - such as muscle tension, heart rate and blood pressure - to prevent headaches or reduce headache pain.    Proceed to emergency room if you experience new or worsening symptoms or symptoms do not resolve, if you have new neurologic symptoms or if headache is severe, or for any concerning symptom.   Cc: Ferd Hibbs, MD  [facility] [address] [place], [place] [address]  Phone [phone] Fax [phone]

## 2016-07-28 NOTE — Patient Instructions (Addendum)
Remember to drink plenty of fluid, eat healthy meals and do not skip any meals. Try to eat protein with a every meal and eat a healthy snack such as fruit or nuts in between meals. Try to keep a regular sleep-wake schedule and try to exercise daily, particularly in the form of walking, 20-30 minutes a day, if you can.   As far as your medications are concerned, I would like to suggest: Topiramate 25mg  for one week then 50mg . Flexeril at night.  As far as diagnostic testing: emg/ncs  I would like to see you back for emg/ncs, sooner if we need to. Please call us with any interim questions, concerns, problems, updates or refill requests.   Our phone number is [phone]. We also have an after hours call service for urgent matters and there is a physician on-call for urgent questions. For any emergencies you know to call 911 or go to the nearest emergency room  Topiramate tablets What is this medicine? TOPIRAMATE (toe PYRE a mate) is used to treat seizures in adults or children with epilepsy. It is also used for the prevention of migraine headaches. This medicine may be used for other purposes; ask your health care provider or pharmacist if you have questions. COMMON BRAND NAME(S): Topamax, Topiragen What should I tell my health care provider before I take this medicine? They need to know if you have any of these conditions: -bleeding disorders -cirrhosis of the liver or liver disease -diarrhea -glaucoma -kidney stones or kidney disease -low blood counts, like low white cell, platelet, or red cell counts -lung disease like asthma, obstructive pulmonary disease, emphysema -metabolic acidosis -on a ketogenic diet -schedule for surgery or a procedure -suicidal thoughts, plans, or attempt; a previous suicide attempt by you or a family member -an unusual or allergic reaction to topiramate, other medicines, foods, dyes, or preservatives -pregnant or trying to get pregnant -breast-feeding How  should I use this medicine? Take this medicine by mouth with a glass of water. Follow the directions on the prescription label. Do not crush or chew. You may take this medicine with meals. Take your medicine at regular intervals. Do not take it more often than directed. Talk to your pediatrician regarding the use of this medicine in children. Special care may be needed. While this drug may be prescribed for children as young as 362 years of age for selected conditions, precautions do apply. Overdosage: If you think you have taken too much of this medicine contact a poison control center or emergency room at once. NOTE: This medicine is only for you. Do not share this medicine with others. What if I miss a dose? If you miss a dose, take it as soon as you can. If your next dose is to be taken in less than 6 hours, then do not take the missed dose. Take the next dose at your regular time. Do not take double or extra doses. What may interact with this medicine? Do not take this medicine with any of the following medications: -probenecid This medicine may also interact with the following medications: -acetazolamide -alcohol -amitriptyline -aspirin and aspirin-like medicines -birth control pills -certain medicines for depression -certain medicines for seizures -certain medicines that treat or prevent blood clots like warfarin, enoxaparin, dalteparin, apixaban, dabigatran, and rivaroxaban -digoxin -hydrochlorothiazide -lithium -medicines for pain, sleep, or muscle relaxation -metformin -methazolamide -NSAIDS, medicines for pain and inflammation, like ibuprofen or naproxen -pioglitazone -risperidone This list may not describe all possible interactions. Give your health care  provider a list of all the medicines, herbs, non-prescription drugs, or dietary supplements you use. Also tell them if you smoke, drink alcohol, or use illegal drugs. Some items may interact with your medicine. What should I  watch for while using this medicine? Visit your doctor or health care professional for regular checks on your progress. Do not stop taking this medicine suddenly. This increases the risk of seizures if you are using this medicine to control epilepsy. Wear a medical identification bracelet or chain to say you have epilepsy or seizures, and carry a card that lists all your medicines. This medicine can decrease sweating and increase your body temperature. Watch for signs of deceased sweating or fever, especially in children. Avoid extreme heat, hot baths, and saunas. Be careful about exercising, especially in hot weather. Contact your health care provider right away if you notice a fever or decrease in sweating. You should drink plenty of fluids while taking this medicine. If you have had kidney stones in the past, this will help to reduce your chances of forming kidney stones. If you have stomach pain, with nausea or vomiting and yellowing of your eyes or skin, call your doctor immediately. You may get drowsy, dizzy, or have blurred vision. Do not drive, use machinery, or do anything that needs mental alertness until you know how this medicine affects you. To reduce dizziness, do not sit or stand up quickly, especially if you are an older patient. Alcohol can increase drowsiness and dizziness. Avoid alcoholic drinks. If you notice blurred vision, eye pain, or other eye problems, seek medical attention at once for an eye exam. The use of this medicine may increase the chance of suicidal thoughts or actions. Pay special attention to how you are responding while on this medicine. Any worsening of mood, or thoughts of suicide or dying should be reported to your health care professional right away. This medicine may increase the chance of developing metabolic acidosis. If left untreated, this can cause kidney stones, bone disease, or slowed growth in children. Symptoms include breathing fast, fatigue, loss of  appetite, irregular heartbeat, or loss of consciousness. Call your doctor immediately if you experience any of these side effects. Also, tell your doctor about any surgery you plan on having while taking this medicine since this may increase your risk for metabolic acidosis. Birth control pills may not work properly while you are taking this medicine. Talk to your doctor about using an extra method of birth control. Women who become pregnant while using this medicine may enroll in the [place] American Antiepileptic Drug Pregnancy Registry by calling [phone]. This registry collects information about the safety of antiepileptic drug use during pregnancy. What side effects may I notice from receiving this medicine? Side effects that you should report to your doctor or health care professional as soon as possible: -allergic reactions like skin rash, itching or hives, swelling of the face, lips, or tongue -decreased sweating and/or rise in body temperature -depression -difficulty breathing, fast or irregular breathing patterns -difficulty speaking -difficulty walking or controlling muscle movements -hearing impairment -redness, blistering, peeling or loosening of the skin, including inside the mouth -tingling, pain or numbness in the hands or feet -unusual bleeding or bruising -unusually weak or tired -worsening of mood, thoughts or actions of suicide or dying Side effects that usually do not require medical attention (report to your doctor or health care professional if they continue or are bothersome): -altered taste -back pain, joint or muscle aches and pains -diarrhea,  or constipation -headache -loss of appetite -nausea -stomach upset, indigestion -tremors This list may not describe all possible side effects. Call your doctor for medical advice about side effects. You may report side effects to FDA at 1-800-FDA-1088. Where should I keep my medicine? Keep out of the reach of  children. Store at room temperature between 15 and 30 degrees C (59 and 86 degrees F) in a tightly closed container. Protect from moisture. Throw away any unused medicine after the expiration date. NOTE: This sheet is a summary. It may not cover all possible information. If you have questions about this medicine, talk to your doctor, pharmacist, or health care provider.  2018 Elsevier/Gold Standard (2013-05-12 23:17:57)  Cyclobenzaprine tablets What is this medicine? CYCLOBENZAPRINE (sye kloe BEN za preen) is a muscle relaxer. It is used to treat muscle pain, spasms, and stiffness. This medicine may be used for other purposes; ask your health care provider or pharmacist if you have questions. COMMON BRAND NAME(S): Fexmid, Flexeril What should I tell my health care provider before I take this medicine? They need to know if you have any of these conditions: -heart disease, irregular heartbeat, or previous heart attack -liver disease -thyroid problem -an unusual or allergic reaction to cyclobenzaprine, tricyclic antidepressants, lactose, other medicines, foods, dyes, or preservatives -pregnant or trying to get pregnant -breast-feeding How should I use this medicine? Take this medicine by mouth with a glass of water. Follow the directions on the prescription label. If this medicine upsets your stomach, take it with food or milk. Take your medicine at regular intervals. Do not take it more often than directed. Talk to your pediatrician regarding the use of this medicine in children. Special care may be needed. Overdosage: If you think you have taken too much of this medicine contact a poison control center or emergency room at once. NOTE: This medicine is only for you. Do not share this medicine with others. What if I miss a dose? If you miss a dose, take it as soon as you can. If it is almost time for your next dose, take only that dose. Do not take double or extra doses. What may interact with  this medicine? Do not take this medicine with any of the following medications: -certain medicines for fungal infections like fluconazole, itraconazole, ketoconazole, posaconazole, voriconazole -cisapride -dofetilide -dronedarone -halofantrine -levomethadyl -MAOIs like Carbex, Eldepryl, Marplan, Nardil, and Parnate -narcotic medicines for cough -pimozide -thioridazine -ziprasidone This medicine may also interact with the following medications: -alcohol -antihistamines for allergy, cough and cold -certain medicines for anxiety or sleep -certain medicines for cancer -certain medicines for depression like amitriptyline, fluoxetine, sertraline -certain medicines for infection like alfuzosin, chloroquine, clarithromycin, levofloxacin, mefloquine, pentamidine, troleandomycin -certain medicines for irregular heart beat -certain medicines for seizures like phenobarbital, primidone -contrast dyes -general anesthetics like halothane, isoflurane, methoxyflurane, propofol -local anesthetics like lidocaine, pramoxine, tetracaine -medicines that relax muscles for surgery -narcotic medicines for pain -other medicines that prolong the QT interval (cause an abnormal heart rhythm) -phenothiazines like chlorpromazine, mesoridazine, prochlorperazine This list may not describe all possible interactions. Give your health care provider a list of all the medicines, herbs, non-prescription drugs, or dietary supplements you use. Also tell them if you smoke, drink alcohol, or use illegal drugs. Some items may interact with your medicine. What should I watch for while using this medicine? Tell your doctor or health care professional if your symptoms do not start to get better or if they get worse. You may get drowsy or  dizzy. Do not drive, use machinery, or do anything that needs mental alertness until you know how this medicine affects you. Do not stand or sit up quickly, especially if you are an older patient.  This reduces the risk of dizzy or fainting spells. Alcohol may interfere with the effect of this medicine. Avoid alcoholic drinks. If you are taking another medicine that also causes drowsiness, you may have more side effects. Give your health care provider a list of all medicines you use. Your doctor will tell you how much medicine to take. Do not take more medicine than directed. Call emergency for help if you have problems breathing or unusual sleepiness. Your mouth may get dry. Chewing sugarless gum or sucking hard candy, and drinking plenty of water may help. Contact your doctor if the problem does not go away or is severe. What side effects may I notice from receiving this medicine? Side effects that you should report to your doctor or health care professional as soon as possible: -allergic reactions like skin rash, itching or hives, swelling of the face, lips, or tongue -breathing problems -chest pain -fast, irregular heartbeat -hallucinations -seizures -unusually weak or tired Side effects that usually do not require medical attention (report to your doctor or health care professional if they continue or are bothersome): -headache -nausea, vomiting This list may not describe all possible side effects. Call your doctor for medical advice about side effects. You may report side effects to FDA at 1-800-FDA-1088. Where should I keep my medicine? Keep out of the reach of children. Store at room temperature between 15 and 30 degrees C (59 and 86 degrees F). Keep container tightly closed. Throw away any unused medicine after the expiration date. NOTE: This sheet is a summary. It may not cover all possible information. If you have questions about this medicine, talk to your doctor, pharmacist, or health care provider.  2018 Elsevier/Gold Standard (2015-02-16 12:05:46)

## 2016-07-29 ENCOUNTER — Encounter: Payer: Self-pay | Admitting: Neurology

## 2016-08-29 ENCOUNTER — Encounter: Payer: Self-pay | Admitting: Neurology

## 2016-08-29 DIAGNOSIS — G43709 Chronic migraine without aura, not intractable, without status migrainosus: Secondary | ICD-10-CM

## 2016-09-01 ENCOUNTER — Telehealth: Payer: Self-pay | Admitting: Neurology

## 2016-09-01 NOTE — Telephone Encounter (Signed)
Pt called today checking status of an earlier appt for NCV/EMG (she has an unexpected meeting on 5/3) Next available was 5/17. She decided to check with job and call back if necessary. Pt also said she sent an email 4/10 but has not heard back from anyone, she did not know if it went thru. I relayed the msg was rec'd and apologized she had not rec'd a return call. I advised her Dr A and RN are not in the office today but she would rec' a call back on Monday.

## 2016-09-04 MED ORDER — TOPIRAMATE 100 MG PO TABS: 100.0000 mg | ORAL_TABLET | Freq: Every day | ORAL | 5 refills | Status: DC

## 2016-09-04 NOTE — Telephone Encounter (Signed)
Sent reply to pt through MyChart re: Topamax increase.

## 2016-09-19 NOTE — Telephone Encounter (Signed)
Patient called office in reference to Topamax.  Patient states she is having a lot of numbness/tingling on head, face, lips, and going down arms.  Continues to have headaches and in general not feeling well.  Patient states she is having muscle twitching.  Please call

## 2016-09-19 NOTE — Telephone Encounter (Signed)
Called pt back and let her know that common side effects of Topamax 100 mg include numbness or tingly feeling in the hands or feet. Says that she continues to have HA and became anxious when numbness started in the face. Agreed to go back down on topiramate dosage to 75 mg tonight to see if symptoms improve. Also saw her PCP last week who put her on prednisone and doxy d/t sinus pain/pressure. C/o feeling really bad today. She is scheduled for NCV/EMG on Thursday.

## 2016-09-19 NOTE — Telephone Encounter (Signed)
Tell her to decrease Topiramate and we will discuss thursday

## 2016-09-20 NOTE — Telephone Encounter (Signed)
Called pt who says that she did decrease topiramate last night, no improvement in symptoms as of yet, will discuss further w/ Dr. Lucia Gaskins tomorrow and call back if things get worse before then. Verbalized understanding and appreciation for call.

## 2016-09-21 ENCOUNTER — Ambulatory Visit (INDEPENDENT_AMBULATORY_CARE_PROVIDER_SITE_OTHER): Payer: Self-pay | Admitting: Neurology

## 2016-09-21 ENCOUNTER — Ambulatory Visit (INDEPENDENT_AMBULATORY_CARE_PROVIDER_SITE_OTHER): Payer: BLUE CROSS/BLUE SHIELD | Admitting: Neurology

## 2016-09-21 DIAGNOSIS — G43009 Migraine without aura, not intractable, without status migrainosus: Secondary | ICD-10-CM | POA: Diagnosis not present

## 2016-09-21 DIAGNOSIS — R531 Weakness: Secondary | ICD-10-CM

## 2016-09-21 DIAGNOSIS — IMO0001 Reserved for inherently not codable concepts without codable children: Secondary | ICD-10-CM

## 2016-09-21 DIAGNOSIS — M542 Cervicalgia: Secondary | ICD-10-CM

## 2016-09-21 DIAGNOSIS — R202 Paresthesia of skin: Secondary | ICD-10-CM

## 2016-09-21 DIAGNOSIS — R209 Unspecified disturbances of skin sensation: Secondary | ICD-10-CM

## 2016-09-21 DIAGNOSIS — Z0289 Encounter for other administrative examinations: Secondary | ICD-10-CM

## 2016-09-21 MED ORDER — NORTRIPTYLINE HCL 10 MG PO CAPS: 10.0000 mg | ORAL_CAPSULE | Freq: Every day | ORAL | 12 refills | Status: DC

## 2016-09-21 NOTE — Patient Instructions (Signed)
Remember to drink plenty of fluid, eat healthy meals and do not skip any meals. Try to eat protein with a every meal and eat a healthy snack such as fruit or nuts in between meals. Try to keep a regular sleep-wake schedule and try to exercise daily, particularly in the form of walking, 20-30 minutes a day, if you can.   As far as your medications are concerned, I would like to suggest: Nortriptyline 10mg   I would like to see you back in 3 months, sooner if we need to. Please call us with any interim questions, concerns, problems, updates or refill requests.   Our phone number is [phone]. We also have an after hours call service for urgent matters and there is a physician on-call for urgent questions. For any emergencies you know to call 911 or go to the nearest emergency room  Nortriptyline capsules What is this medicine? NORTRIPTYLINE (nor TRIP ti leen) is used to treat depression. This medicine may be used for other purposes; ask your health care provider or pharmacist if you have questions. COMMON BRAND NAME(S): Aventyl, Pamelor What should I tell my health care provider before I take this medicine? They need to know if you have any of these conditions: -an alcohol problem -bipolar disorder or schizophrenia -difficulty passing urine, prostate trouble -glaucoma -heart disease or recent heart attack -liver disease -over active thyroid -seizures -thoughts or plans of suicide or a previous suicide attempt or family history of suicide attempt -an unusual or allergic reaction to nortriptyline, other medicines, foods, dyes, or preservatives -pregnant or trying to get pregnant -breast-feeding How should I use this medicine? Take this medicine by mouth with a glass of water. Follow the directions on the prescription label. Take your doses at regular intervals. Do not take it more often than directed. Do not stop taking this medicine suddenly except upon the advice of your doctor. Stopping  this medicine too quickly may cause serious side effects or your condition may worsen. A special MedGuide will be given to you by the pharmacist with each prescription and refill. Be sure to read this information carefully each time. Talk to your pediatrician regarding the use of this medicine in children. Special care may be needed. Overdosage: If you think you have taken too much of this medicine contact a poison control center or emergency room at once. NOTE: This medicine is only for you. Do not share this medicine with others. What if I miss a dose? If you miss a dose, take it as soon as you can. If it is almost time for your next dose, take only that dose. Do not take double or extra doses. What may interact with this medicine? Do not take this medicine with any of the following medications: -arsenic trioxide -certain medicines medicines for irregular heart beat -cisapride -halofantrine -linezolid -MAOIs like Carbex, Eldepryl, Marplan, Nardil, and Parnate -methylene blue (injected into a vein) -other medicines for mental depression -phenothiazines like perphenazine, thioridazine and chlorpromazine -pimozide -probucol -procarbazine -sparfloxacin -St. John's Wort -ziprasidone This medicine may also interact with any of the following medications: -atropine and related drugs like hyoscyamine, scopolamine, tolterodine and others -barbiturate medicines for inducing sleep or treating seizures, such as phenobarbital -cimetidine -medicines for diabetes -medicines for seizures like carbamazepine or phenytoin -reserpine -thyroid medicine This list may not describe all possible interactions. Give your health care provider a list of all the medicines, herbs, non-prescription drugs, or dietary supplements you use. Also tell them if you smoke, drink alcohol,  or use illegal drugs. Some items may interact with your medicine. What should I watch for while using this medicine? Tell your doctor  if your symptoms do not get better or if they get worse. Visit your doctor or health care professional for regular checks on your progress. Because it may take several weeks to see the full effects of this medicine, it is important to continue your treatment as prescribed by your doctor. Patients and their families should watch out for new or worsening thoughts of suicide or depression. Also watch out for sudden changes in feelings such as feeling anxious, agitated, panicky, irritable, hostile, aggressive, impulsive, severely restless, overly excited and hyperactive, or not being able to sleep. If this happens, especially at the beginning of treatment or after a change in dose, call your health care professional. Bonita QuinYou may get drowsy or dizzy. Do not drive, use machinery, or do anything that needs mental alertness until you know how this medicine affects you. Do not stand or sit up quickly, especially if you are an older patient. This reduces the risk of dizzy or fainting spells. Alcohol may interfere with the effect of this medicine. Avoid alcoholic drinks. Do not treat yourself for coughs, colds, or allergies without asking your doctor or health care professional for advice. Some ingredients can increase possible side effects. Your mouth may get dry. Chewing sugarless gum or sucking hard candy, and drinking plenty of water may help. Contact your doctor if the problem does not go away or is severe. This medicine may cause dry eyes and blurred vision. If you wear contact lenses you may feel some discomfort. Lubricating drops may help. See your eye doctor if the problem does not go away or is severe. This medicine can cause constipation. Try to have a bowel movement at least every 2 to 3 days. If you do not have a bowel movement for 3 days, call your doctor or health care professional. This medicine can make you more sensitive to the sun. Keep out of the sun. If you cannot avoid being in the sun, wear protective  clothing and use sunscreen. Do not use sun lamps or tanning beds/booths. What side effects may I notice from receiving this medicine? Side effects that you should report to your doctor or health care professional as soon as possible: -allergic reactions like skin rash, itching or hives, swelling of the face, lips, or tongue -anxious -breathing problems -changes in vision -confusion -elevated mood, decreased need for sleep, racing thoughts, impulsive behavior -eye pain -fast, irregular heartbeat -feeling faint or lightheaded, falls -feeling agitated, angry, or irritable -fever with increased sweating -hallucination, loss of contact with reality -seizures -stiff muscles -suicidal thoughts or other mood changes -tingling, pain, or numbness in the feet or hands -trouble passing urine or change in the amount of urine -trouble sleeping -unusually weak or tired -vomiting -yellowing of the eyes or skin Side effects that usually do not require medical attention (report to your doctor or health care professional if they continue or are bothersome): -change in sex drive or performance -change in appetite or weight -constipation -dizziness -dry mouth -nausea -tired -tremors -upset stomach This list may not describe all possible side effects. Call your doctor for medical advice about side effects. You may report side effects to FDA at 1-800-FDA-1088. Where should I keep my medicine? Keep out of the reach of children. Store at room temperature between 15 and 30 degrees C (59 and 86 degrees F). Keep container tightly closed. Throw away  any unused medicine after the expiration date. NOTE: This sheet is a summary. It may not cover all possible information. If you have questions about this medicine, talk to your doctor, pharmacist, or health care provider.  2018 Elsevier/Gold Standard (2015-10-08 12:53:08)

## 2016-09-21 NOTE — Progress Notes (Signed)
Full Name: Amy DaftJenny Edenfield Gender: Female MRN #: [id number] Date of Birth: 08/07/1975    Visit Date: 09/21/2016 08:15 Age: [age] Years 5 Months Old Examining Physician: Naomie DeanAntonia Damon Baisch, MD  Referring Physician: Adela LankBrown-Patram, Melissa    History:  Amy Carney is a 41 y.o. female here as a referral from Dr. Sylvan CheeseBrown-Patram for right-sided numbness and weakness and tingling.   Summary: EMG/NCS was performed on the right upper and lower extremities.   All muscles and nerves (as detailed in the following tables) were normal  Conclusion: This is a normal study  Cc: Brown-Patram, Joycie PeekMelissa  Sander Remedios, M.D.  [facility] [address] [place], [place] [address] Tel: [phone] Fax: [phone]        [facility] [place]    Nerve / Sites Rec. Site Latency Ref. Amplitude Ref. Rel Amp Segments Distance Velocity Ref. Area    ms ms mV mV %  cm m/s m/s mVms  R Median - APB     Wrist APB 3.9 ?4.4 10.2 ?4.0 100 Wrist - APB 7   44.6     Upper arm APB 8.2  9.8  96.4 Upper arm - Wrist 25 58 ?49 43.5  R Ulnar - ADM     Wrist ADM 2.8 ?3.3 13.1 ?6.0 100 Wrist - ADM 7   36.9     B.Elbow ADM 6.4  12.0  91.7 B.Elbow - Wrist 19 53 ?49 36.4     A.Elbow ADM 8.2  10.2  84.8 A.Elbow - B.Elbow 10 55 ?49 33.9         A.Elbow - Wrist      R Peroneal - EDB     Ankle EDB 4.3 ?6.5 4.6 ?2.0 100 Ankle - EDB 9   17.1     Fib head EDB 11.7  3.9  86.5 Fib head - Ankle 33 45 ?44 16.2     Pop fossa EDB 13.8  3.9  97.6 Pop fossa - Fib head 10 49 ?44 16.2         Pop fossa - Ankle      R Tibial - AH     Ankle AH 4.6 ?5.8 24.7 ?4.0 100 Ankle - AH 9   52.1     Pop fossa AH 14.6  15.8  64 Pop fossa - Ankle 41 41 ?41 41.5             SNC    Nerve / Sites Rec. Site Peak Lat Ref.  Amp Ref. Segments Distance    ms ms V V  cm  R Sural - Ankle (Calf)     Calf Ankle 3.59 ?4.40 9 ?6 Calf - Ankle 14  R Superficial peroneal - Ankle     Lat leg Ankle 4.01 ?4.40 7 ?6 Lat leg - Ankle 14  R Median - Orthodromic (Dig II,  Mid palm)     Dig II Wrist 3.39 ?3.40 19 ?10 Dig II - Wrist 13  R Ulnar - Orthodromic, (Dig V, Mid palm)     Dig V Wrist 2.24 ?3.10 12 ?5 Dig V - Wrist 6911             F  Wave    Nerve F Lat Ref.   ms ms  R Ulnar - ADM 28.5 ?32.0  R Tibial - AH 56.8 ?56.0         EMG full       EMG Summary Table    Spontaneous MUAP Recruitment  Muscle IA Fib PSW Fasc Other Amp Dur. Poly Pattern  R. Deltoid Normal None None None _______ Normal Normal Normal Normal  R. Trapezius Normal None None None _______ Normal Normal Normal Normal  R. Pronator teres Normal None None None _______ Normal Normal Normal Normal  R. First dorsal interosseous Normal None None None _______ Normal Normal Normal Normal  R. Opponens pollicis Normal None None None _______ Normal Normal Normal Normal  R. Vastus medialis Normal None None None _______ Normal Normal Normal Normal  R. Tibialis anterior Normal None None None _______ Normal Normal Normal Normal  R. Gastrocnemius (Medial head) Normal None None None _______ Normal Normal Normal Normal  R. Extensor hallucis longus Normal None None None _______ Normal Normal Normal Normal  R. Abductor hallucis Normal None None None _______ Normal Normal Normal Normal

## 2016-09-23 NOTE — Procedures (Signed)
Full Name: Amy Carney Gender: Female MRN #: [id number] Date of Birth: 08/07/1975    Visit Date: 09/21/2016 08:15 Age: [age] Years 5 Months Old Examining Physician: Naomie DeanAntonia Orvell Careaga, MD  Referring Physician: Adela LankBrown-Patram, Melissa    History:  Amy DaftJenny Carney is a 41 y.o. female here as a referral from Dr. Sylvan CheeseBrown-Patram for right-sided numbness and weakness and tingling.   Summary: EMG/NCS was performed on the right upper and lower extremities.   All muscles and nerves (as detailed in the following tables) were normal  Conclusion: This is a normal study  Cc: Brown-Patram, Joycie PeekMelissa  Quandre Polinski, M.D.  [facility] [address] [place], [place] [address] Tel: [phone] Fax: [phone]        [facility]    Nerve / Sites Rec. Site Latency Ref. Amplitude Ref. Rel Amp Segments Distance Velocity Ref. Area    ms ms mV mV %  cm m/s m/s mVms  R Median - APB     Wrist APB 3.9 ?4.4 10.2 ?4.0 100 Wrist - APB 7   44.6     Upper arm APB 8.2  9.8  96.4 Upper arm - Wrist 25 58 ?49 43.5  R Ulnar - ADM     Wrist ADM 2.8 ?3.3 13.1 ?6.0 100 Wrist - ADM 7   36.9     B.Elbow ADM 6.4  12.0  91.7 B.Elbow - Wrist 19 53 ?49 36.4     A.Elbow ADM 8.2  10.2  84.8 A.Elbow - B.Elbow 10 55 ?49 33.9         A.Elbow - Wrist      R Peroneal - EDB     Ankle EDB 4.3 ?6.5 4.6 ?2.0 100 Ankle - EDB 9   17.1     Fib head EDB 11.7  3.9  86.5 Fib head - Ankle 33 45 ?44 16.2     Pop fossa EDB 13.8  3.9  97.6 Pop fossa - Fib head 10 49 ?44 16.2         Pop fossa - Ankle      R Tibial - AH     Ankle AH 4.6 ?5.8 24.7 ?4.0 100 Ankle - AH 9   52.1     Pop fossa AH 14.6  15.8  64 Pop fossa - Ankle 41 41 ?41 41.5             SNC    Nerve / Sites Rec. Site Peak Lat Ref.  Amp Ref. Segments Distance    ms ms V V  cm  R Sural - Ankle (Calf)     Calf Ankle 3.59 ?4.40 9 ?6 Calf - Ankle 14  R Superficial peroneal - Ankle     Lat leg Ankle 4.01 ?4.40 7 ?6 Lat leg - Ankle 14  R Median - Orthodromic (Dig II,  Mid palm)     Dig II Wrist 3.39 ?3.40 19 ?10 Dig II - Wrist 13  R Ulnar - Orthodromic, (Dig V, Mid palm)     Dig V Wrist 2.24 ?3.10 12 ?5 Dig V - Wrist 6911             F  Wave    Nerve F Lat Ref.   ms ms  R Ulnar - ADM 28.5 ?32.0  R Tibial - AH 56.8 ?56.0         EMG full       EMG Summary Table    Spontaneous MUAP Recruitment  Muscle IA Fib PSW Fasc Other Amp Dur. Poly Pattern  R. Deltoid Normal None None None _______ Normal Normal Normal Normal  R. Trapezius Normal None None None _______ Normal Normal Normal Normal  R. Pronator teres Normal None None None _______ Normal Normal Normal Normal  R. First dorsal interosseous Normal None None None _______ Normal Normal Normal Normal  R. Opponens pollicis Normal None None None _______ Normal Normal Normal Normal  R. Vastus medialis Normal None None None _______ Normal Normal Normal Normal  R. Tibialis anterior Normal None None None _______ Normal Normal Normal Normal  R. Gastrocnemius (Medial head) Normal None None None _______ Normal Normal Normal Normal  R. Extensor hallucis longus Normal None None None _______ Normal Normal Normal Normal  R. Abductor hallucis Normal None None None _______ Normal Normal Normal Normal

## 2016-09-23 NOTE — Progress Notes (Signed)
[name]   Provider:  Dr Lucia Gaskins Referring Provider: Rhea Bleacher* Primary Care Physician:  Rhea Bleacher*  CC:  Migraines and neck pain  Interval History 09/23/2016: She is experiencing side effects tp the Topiramate. Muscle twitching, more numbness espcially in the face and head, and headaches not improving. Will try integrative therapies for neck pain and migraines.Start Nortriptyline and stop Topiramate. Reviewed mri images in detail with patient.   HPI:  Amy Carney is a 41 y.o. female here as a referral from Dr. Sylvan Cheese for right-sided numbness and weakness and tingling. PMHx anxiety and attacks, migraine. In early January she had to be on Levaquin for sinus infection. She has had joint pain in the past due to Levaquin. On day 4 of the antibiotic she noted right-sided tingling and numbness the entire right side ("could split it right down the middle") on the right. Then she had difficulty breathing maybe due to being uncomfortable, she feels like the right side of her body feels restless. She has chronic tingling from the knees down. She has migraines and headaches and feels lhis comes from tension in the neck, she goes to massage which helps, tension makes it worse. She goes to a [place]. Recently she has daily headaches for 4-6 months. She has tightness in the neck as prodrome, she has vision changes, nausea, right side of the head with throbbing and pressure. Can last for days. She uses excedrin migraine. The right sided symptoms do not coincide with the migraines. It is continuous right-sided symptoms but waxes and wanes. It can be severely uncomfortable. Nothing makes the right-sided symptoms better. She is seeing a rheumatologist as her ANA was positive. She has weakness on the right side and loss of muscle mass moreso on the right. She has nerve pain below the knees, she feels burnign in the feet occasionally. No other focal neurologic  deficits, associated symptoms, inciting events or modifiable factors.  Reviewed notes, labs and imaging from outside physicians, which showed:  Personally reviewed images of the brain and cervical spine and agree with the following:  IMPRESSION: 1. Normal MRI of the brain. No evidence of demyelinating disease. 2. Mild cervical degenerative disc disease without spinal canal or neural foraminal stenosis  Reviewed notes from primary care. She has a feeling of numbness in the right arm and right lower extremity and the left side she has no numbness she has some pain to the left arm and the muscle region that actually she can push on it and re-create it does not seem to be related to her chest and her anginal episode. She does have bilateral neck discomfort and she has a headache 3-4 days out of the week sometimes there is a migraine sometimes there is just a headache that seems to be coming from the neck movement of the head. She is easily upset about all of this and she is crying and frustrated over the fact that she is just tired feeling bad. Last year she had some similar issues more malaise that we did last for which really did not show anything significant for her is a strong family history of coronary disease which she felt that the fear as well as atrial fibrillation medication she is having none of the symptoms as well. She describes muscle fatigue and is that she is not dropping objects and she feels like she is just not able to sometimes even hold herself up which would normally today she has no speech abnormalities  nor has there been loss of vision though she can tell a change in her vision that could just be age-related. No rashes.   Review of Systems: Patient complains of symptoms per HPI as well as the following symptoms: no CP, no SOB. Pertinent negatives per HPI. All others negative.   Social History   Social History  . Marital status: Married    Spouse name: N/A  . Number of  children: 2  . 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 Smoking status: Former Smoker    Quit date: 2003  . Smokeless tobacco: Never Used  . Alcohol use 0.0 oz/week     Comment: Social  . Drug use: No  . Sexual activity: Not on file     Comment: Married   Other Topics Concern  . Not on file   Social History Narrative   Lives at home w/ her husband and children   Right-handed   Caffeine: 2-3 cups per day       Family History  Problem Relation Age of Onset  . Colon cancer      Great Maternal Grandmother  . Barrett's esophagus Paternal Grandmother   . Osteoporosis Paternal Grandmother   . Migraines Mother   . Skin cancer Mother   . Hypertension Maternal Grandmother   . Lymphoma Maternal Grandfather   . Stroke Paternal Grandfather     Past Medical History:  Diagnosis Date  . Anemia   . Anxiety   . Bilateral neck pain   . Chronic migraine without aura without status migrainosus, not intractable   . Fatigue   . Gastritis   . GERD (gastroesophageal reflux disease)   . Hiatal hernia   . Internal hemorrhoids   . Neuralgia   . Recurrent major depression in remission (HCC)   . Sinusitis   . Skin tag of perianal region   . Weakness     Past Surgical History:  Procedure Laterality Date  . ANAL SPHINCTEROTOMY    . CESAREAN SECTION    . DILATION AND CURETTAGE OF UTERUS    . ENDOMETRIAL ABLATION    . LAPAROSCOPY    . NASAL SINUS SURGERY    . TONSILLECTOMY    . TUBAL LIGATION      Current Outpatient Prescriptions  Medication Sig Dispense Refill  . aspirin-acetaminophen-caffeine (EXCEDRIN MIGRAINE) 250-250-65 MG tablet Take by mouth every 8 (eight) hours as needed for headache.    . cyclobenzaprine (FLEXERIL) 10 MG tablet Take 1 tablet (10 mg total) by mouth at bedtime. 30 tablet 6  . ibuprofen (ADVIL,MOTRIN) 200 MG tablet Take 200 mg by mouth every 8 (eight) hours as needed.    . metoCLOPramide (REGLAN) 10 MG  tablet Take by mouth as needed.     . nortriptyline (PAMELOR) 10 MG capsule Take 1 capsule (10 mg total) by mouth at bedtime. 30 capsule 12  . pantoprazole (PROTONIX) 40 MG tablet Take 40 mg by mouth.    . Probiotic Product (PROBIOTIC DAILY PO) Take 1 capsule by mouth daily.    . ranitidine (ZANTAC) 150 MG capsule Take 150 mg by mouth as needed for heartburn. Reported on 12/09/2015    . sucralfate (CARAFATE) 1 g tablet Take by mouth as needed.     . topiramate (TOPAMAX) 100 MG tablet Take 1 tablet (100 mg total) by mouth at bedtime. 30 tablet 5   No current facility-administered medications for this visit.  Allergies as of 09/21/2016 - Review Complete 07/28/2016  Allergen Reaction Noted  . Penicillins Hives and Rash 11/17/2015  . Sulfa antibiotics Hives and Rash 11/17/2015    Vitals: There were no vitals taken for this visit. Last Weight:  Wt Readings from Last 1 Encounters:  07/28/16 175 lb (79.4 kg)   Last Height:   Ht Readings from Last 1 Encounters:  07/28/16 6' (1.829 m)   Physical exam: Exam: Gen: NAD, conversant, well nourised, well groomed                     CV: RRR, no MRG. No Carotid Bruits. No peripheral edema, warm, nontender Eyes: Conjunctivae clear without exudates or hemorrhage  Neuro: Detailed Neurologic Exam  Speech:    Speech is normal; fluent and spontaneous with normal comprehension.  Cognition:    The patient is oriented to person, place, and time;     recent and remote memory intact;     language fluent;     normal attention, concentration,     fund of knowledge Cranial Nerves:    The pupils are equal, round, and reactive to light. The fundi are normal and spontaneous venous pulsations are present. Visual fields are full to finger confrontation. Extraocular movements are intact. Trigeminal sensation is intact and the muscles of mastication are normal. The face is symmetric. The palate elevates in the midline. Hearing intact. Voice is normal.  Shoulder shrug is normal. The tongue has normal motion without fasciculations.   Coordination:    Normal finger to nose and heel to shin. Normal rapid alternating movements.   Gait:    Heel-toe and tandem gait are normal.   Motor Observation:    No asymmetry, no atrophy, and no involuntary movements noted. Tone:    Normal muscle tone.    Posture:    Posture is normal. normal erect    Strength: Mild weakness right upper and lower extremities 5-/5 otherwise strength is V/V in the upper and lower limbs.      Sensation: Slight decrease to pinprick in right arm and right leg as compared to the left arm and left leg. Also distal decrease to pinprick and temperature in a gradient fashion in the distal lower extremities bilaterally and symmetric. Intact vibration.     Reflex Exam:  DTR's:    Absent AJs otherwise deep tendon reflexes in the upper and lower extremities are hyporeflexic but symmetric bilaterally.   Toes:    The toes are equivocal bilaterally.   Clonus:    Clonus is absent.      Assessment/Plan:  41 year old patient with chronic migraines, right-sided numbness and tingling(face, thorax/abd/pelvis,leg and arm "straight down the middle"). MRI brain was normal. May be migrainous phenomenon as no other etiology suspected. There is some anxiety which may be contributory.   Emg/NCS of the right arm and leg was normal  Side effects to Topiramate. Will try integrative therapies for neck pain and migraines.Start Nortriptyline and stop Topiramate. Flexeril at night for muscle pain cervical area  Discussed: To prevent or relieve headaches, try the following:  Cool Compress. Lie down and place a cool compress on your head.   Avoid headache triggers. If certain foods or odors seem to have triggered your migraines in the past, avoid them. A headache diary might help you identify triggers.   Include physical activity in your daily routine. Try a daily walk or other  moderate aerobic exercise.   Manage stress. Find healthy ways to  cope with the stressors, such as delegating tasks on your to-do list.   Practice relaxation techniques. Try deep breathing, yoga, massage and visualization.   Eat regularly. Eating regularly scheduled meals and maintaining a healthy diet might help prevent headaches. Also, drink plenty of fluids.   Follow a regular sleep schedule. Sleep deprivation might contribute to headaches  Consider biofeedback. With this mind-body technique, you learn to control certain bodily functions - such as muscle tension, heart rate and blood pressure - to prevent headaches or reduce headache pain.    Proceed to emergency room if you experience new or worsening symptoms or symptoms do not resolve, if you have new neurologic symptoms or if headache is severe, or for any concerning symptom.   Cc: Ferd Hibbs, MD  [facility] [address] [place], [place] [address]  Phone [phone] Fax [phone]  A total of 15 minutes was spent face-to-face with this patient (this time separate from emg/ncs). Over half this time was spent on counseling patient on the migraine and neck pain diagnosis and different diagnostic and therapeutic options available.

## 2016-09-23 NOTE — Progress Notes (Signed)
See procedure note.

## 2016-09-25 ENCOUNTER — Telehealth: Payer: Self-pay | Admitting: Neurology

## 2016-09-25 NOTE — Telephone Encounter (Signed)
Sent Referral To [facility] [phone].

## 2016-10-04 ENCOUNTER — Encounter: Payer: Self-pay | Admitting: Neurology

## 2016-12-28 ENCOUNTER — Ambulatory Visit: Payer: BLUE CROSS/BLUE SHIELD | Admitting: Neurology

## 2017-02-20 ENCOUNTER — Other Ambulatory Visit: Payer: Self-pay | Admitting: Obstetrics and Gynecology

## 2017-02-20 DIAGNOSIS — N63 Unspecified lump in unspecified breast: Secondary | ICD-10-CM

## 2017-03-09 ENCOUNTER — Ambulatory Visit
Admission: RE | Admit: 2017-03-09 | Discharge: 2017-03-09 | Disposition: A | Discharge status: Home / Self Care | Payer: BLUE CROSS/BLUE SHIELD | Source: Ambulatory Visit | Attending: Obstetrics and Gynecology | Admitting: Obstetrics and Gynecology

## 2017-03-09 DIAGNOSIS — N63 Unspecified lump in unspecified breast: Secondary | ICD-10-CM

## 2017-04-02 ENCOUNTER — Telehealth: Payer: Self-pay | Admitting: Internal Medicine

## 2017-04-02 MED ORDER — DEXLANSOPRAZOLE 60 MG PO CPDR: 60.0000 mg | DELAYED_RELEASE_CAPSULE | Freq: Every day | ORAL | 3 refills | Status: DC

## 2017-04-02 NOTE — Telephone Encounter (Signed)
Pt states she has been having more problems with epigastric pain and pressure up into her chest and throat. She takes protonix daily but has had to add her carafate and reglan to the protonix. States she has even started taking the protonix bid. Pt asking if there is something different Dr. Rhea BeltonPyrtle may prescribe. Reports the discomfort feels just like it did last year when she was seen. Please advise.

## 2017-04-02 NOTE — Telephone Encounter (Signed)
Spoke with pt and she is aware, script sent to pharmacy. 

## 2017-04-02 NOTE — Telephone Encounter (Signed)
Would change twice daily pantoprazole to Dexilant 60 mg daily Have her let me know if this does not help to alleviate her symptoms

## 2017-05-02 ENCOUNTER — Telehealth: Payer: Self-pay | Admitting: Internal Medicine

## 2017-05-03 NOTE — Telephone Encounter (Signed)
Left message for pt to call back  °

## 2017-05-04 NOTE — Telephone Encounter (Signed)
Okay to refill Carafate to be used 1 g 3 times daily before meals and at bedtime as needed I am not comfortable with chronic Reglan unless we discussed further in person Okay to continue Dexilant 60 mg daily Please clean up her medicine list as pantoprazole is also listed (based on what she is really taking)

## 2017-05-04 NOTE — Telephone Encounter (Signed)
Pt calling requesting a refill on her reglan and carafate prescriptions. These were originally written by urgent care. She has been taking reglan 10mg  qhs and carafate 1gm 1-2 times daily. She takes this along with Dexilant. Please advise if ok to refill.

## 2017-05-07 ENCOUNTER — Other Ambulatory Visit: Payer: Self-pay

## 2017-05-07 MED ORDER — SUCRALFATE 1 GM/10ML PO SUSP: 1.0000 g | Freq: Three times a day (TID) | ORAL | 2 refills | Status: DC

## 2017-05-07 NOTE — Addendum Note (Signed)
Addended by: Selinda MichaelsHUNT, LINDA R on: 05/07/2017 09:27 AM   Modules accepted: Orders

## 2017-05-07 NOTE — Telephone Encounter (Signed)
Spoke with pt and she is aware, script for carafate sent to pharmacy. Pt has an appt scheduled in February with Dr. Rhea BeltonPyrtle.

## 2017-06-29 ENCOUNTER — Other Ambulatory Visit: Payer: Self-pay | Admitting: Specialist

## 2017-06-29 DIAGNOSIS — N644 Mastodynia: Secondary | ICD-10-CM

## 2017-07-02 ENCOUNTER — Ambulatory Visit: Payer: BLUE CROSS/BLUE SHIELD | Admitting: Internal Medicine

## 2017-07-02 ENCOUNTER — Encounter: Payer: Self-pay | Admitting: Internal Medicine

## 2017-07-02 VITALS — BP 100/62 | HR 78 | Ht 72.0 in | Wt 171.8 lb

## 2017-07-02 DIAGNOSIS — K449 Diaphragmatic hernia without obstruction or gangrene: Secondary | ICD-10-CM | POA: Diagnosis not present

## 2017-07-02 DIAGNOSIS — R1013 Epigastric pain: Secondary | ICD-10-CM

## 2017-07-02 DIAGNOSIS — K219 Gastro-esophageal reflux disease without esophagitis: Secondary | ICD-10-CM | POA: Diagnosis not present

## 2017-07-02 MED ORDER — METOCLOPRAMIDE HCL 10 MG PO TABS: 10.0000 mg | ORAL_TABLET | Freq: Two times a day (BID) | ORAL | 0 refills | Status: DC | PRN

## 2017-07-02 MED ORDER — DEXLANSOPRAZOLE 60 MG PO CPDR: 60.0000 mg | DELAYED_RELEASE_CAPSULE | Freq: Every day | ORAL | 3 refills | Status: DC

## 2017-07-02 MED ORDER — SUCRALFATE 1 G PO TABS: 1.0000 g | ORAL_TABLET | Freq: Three times a day (TID) | ORAL | 2 refills | Status: DC

## 2017-07-02 NOTE — Progress Notes (Signed)
Subjective:    Patient ID: Amy Carney, female    DOB: 1975/12/10, 42 y.o.   MRN: [id number]  HPI Kirstan Fentress is a 42 year old female with a history of GERD, small hiatal hernia who is here for follow-up.  She was last seen at the time of upper endoscopy performed on 12/09/2015.  She is here alone today.  I initially saw her in June 2017 to evaluate GERD with epigastric abdominal pain and pressure.  This led to an EGD as discussed above which showed a small hiatal hernia but normal esophageal mucosa.  There was localized mild inflammation in the gastric body which was biopsied and negative for H. pylori.  The examined duodenum was normal.  That time she was maintained on pantoprazole 40 mg once daily and as needed Carafate which she was making into a slurry.  She reports that symptoms have been fairly well controlled but in December 2018 over a 6-8-week.  She developed significant worsening of her reflux and epigastric upper abdominal pressure and pressure in her chest.  Occasionally this would radiate through to her back.  She was switched to Dexilant 60 mg daily but also using Carafate slurry about twice daily and very rarely metoclopramide 10 mg.  She had previously been given metoclopramide by an urgent care for upper abdominal pressure.  The combination of this did improve her symptoms significantly and most recently she has been using Dexilant 60 mg daily and rarely using Carafate and even more rarely using Reglan.  She states the prescription for Reglan is over 74 years old so she is using it that infrequently.  She is not having nausea or vomiting.  She is not having abdominal bloating or any other abdominal pain.  Bowel habits have been regular.  She estimates that she has some degree of reflux and upper abdominal pressure 2 or 3 days/week.  She also reports prior abdominal ultrasound and 2 negative HIDA scans.  Reviewing our records and care everywhere I do not see these reports.  Review  of Systems As per HPI, otherwise negative  Current Medications, Allergies, Past Medical History, Past Surgical History, Family History and Social History were reviewed in [employer] record.     Objective:   Physical Exam BP 100/62   Pulse 78   Ht 6' (1.829 m)   Wt 171 lb 12.8 oz (77.9 kg)   BMI 23.30 kg/m  Constitutional: Well-developed and well-nourished. No distress. HEENT: Normocephalic and atraumatic.  No scleral icterus. Neurological: Alert and oriented to person place and time. Skin: Skin is warm and dry. Psychiatric: Normal mood and affect. Behavior is normal.     Assessment & Plan:  42 year old female with a history of GERD, small hiatal hernia who is here for follow-up.   1. GERD with small HH/epigastric pressure --difficult to know if she is having uncontrolled heartburn despite Dexilant 60 mg daily or if she is having symptomatic hiatal hernia giving her upper abdominal pressure and discomfort.  Gastroparesis is also possible but she does not have significant risk factor for this condition.  She states her gallbladder has been evaluated multiple times in the past and she did not have stones and reports negative HIDA scans.  I discussed possible 24-hour pH and impedance testing to assess degree of reflux control.  Gastric emptying scan would also be an option.  I discussed metoclopramide therapy with her and we also discussed the potential for long-term side effects such as tardive dyskinesia.  I  recommended that this medication be used very sparingly and not at all if possible. --For now we will have her continue the Dexilant started about 6 weeks ago at 60 mg daily --Resume Carafate slurry 3 times daily before meals and at bedtime for 4-8 weeks.   --Call in about 6 weeks to let me know if she is better or still having symptoms as discussed above.  If so consider 24-hour pH and impedance testing and possibly gastric emptying scan --Limited prescription  for metoclopramide 10 mg twice daily as needed without refill to use only if severe reflux or upper abdominal pressure.  Office follow-up in 3-4 months, sooner if needed 25 minutes spent with the patient today. Greater than 50% was spent in counseling and coordination of care with the patient

## 2017-07-02 NOTE — Patient Instructions (Addendum)
Continue Dexilant 60 mg daily.  We have sent the following medications to your pharmacy for you to pick up at your convenience: Carafate tablet-Make into slurry and use three times daily before meals and at bedtime. Reglan-Use sparingly!   Please follow up with Dr Rhea BeltonPyrtle on 10/09/17 at 2:00 pm.  Call our office in 6 weeks to let us know if the change in medication comb workse for your upper abdominal pain and reflux symptoms.  If you are age 42 or older, your body mass index should be between 23-30. Your Body mass index is 23.3 kg/m. If this is out of the aforementioned range listed, please consider follow up with your Primary Care Provider.  If you are age 42 or younger, your body mass index should be between 19-25. Your Body mass index is 23.3 kg/m. If this is out of the aformentioned range listed, please consider follow up with your Primary Care Provider.

## 2017-07-04 ENCOUNTER — Other Ambulatory Visit: Payer: BLUE CROSS/BLUE SHIELD

## 2017-07-05 ENCOUNTER — Ambulatory Visit
Admission: RE | Admit: 2017-07-05 | Discharge: 2017-07-05 | Disposition: A | Discharge status: Home / Self Care | Payer: BLUE CROSS/BLUE SHIELD | Source: Ambulatory Visit | Attending: Specialist | Admitting: Specialist

## 2017-07-05 DIAGNOSIS — N644 Mastodynia: Secondary | ICD-10-CM

## 2017-07-09 ENCOUNTER — Other Ambulatory Visit: Payer: Self-pay | Admitting: *Deleted

## 2017-07-09 MED ORDER — DEXLANSOPRAZOLE 60 MG PO CPDR: 60.0000 mg | DELAYED_RELEASE_CAPSULE | Freq: Every day | ORAL | 1 refills | Status: DC

## 2017-07-12 ENCOUNTER — Telehealth: Payer: Self-pay | Admitting: Internal Medicine

## 2017-07-12 MED ORDER — DEXLANSOPRAZOLE 60 MG PO CPDR: 60.0000 mg | DELAYED_RELEASE_CAPSULE | Freq: Every day | ORAL | 1 refills | Status: DC

## 2017-07-12 NOTE — Telephone Encounter (Signed)
Patient states [facility] is waiting on a approval from the doctor. Informed patient that we sent in the the prescription for Dexilant on 07/09/17 for a 90 day supply. Informed patient that I resent the prescription to [facility] but to call our office back if they still do not receive the prescription. Patient verbalized understanding.

## 2017-07-13 NOTE — Telephone Encounter (Signed)
Spoke with pt and let her know that per [facility] they state the dexilant will be shipped out on Monday 07/16/17. Pt states she has some protonix at home and she will take this until she receives the shipment.

## 2017-07-13 NOTE — Telephone Encounter (Signed)
Patient states she is still having difficulty getting Dexilant refilled through [facility] and is needing medication. Pt is asking if there is an alternative she can have now. Best call back #[phone].

## 2017-10-09 ENCOUNTER — Ambulatory Visit: Payer: BLUE CROSS/BLUE SHIELD | Admitting: Internal Medicine

## 2017-10-09 ENCOUNTER — Encounter: Payer: Self-pay | Admitting: Internal Medicine

## 2017-10-09 VITALS — BP 94/60 | HR 72 | Ht 72.0 in | Wt 174.0 lb

## 2017-10-09 DIAGNOSIS — K219 Gastro-esophageal reflux disease without esophagitis: Secondary | ICD-10-CM | POA: Diagnosis not present

## 2017-10-09 DIAGNOSIS — R1013 Epigastric pain: Secondary | ICD-10-CM | POA: Diagnosis not present

## 2017-10-09 DIAGNOSIS — E538 Deficiency of other specified B group vitamins: Secondary | ICD-10-CM | POA: Diagnosis not present

## 2017-10-09 MED ORDER — METOCLOPRAMIDE HCL 10 MG PO TABS: 10.0000 mg | ORAL_TABLET | Freq: Two times a day (BID) | ORAL | 1 refills | Status: DC | PRN

## 2017-10-09 MED ORDER — SUCRALFATE 1 G PO TABS: 1.0000 g | ORAL_TABLET | Freq: Three times a day (TID) | ORAL | 1 refills | Status: DC

## 2017-10-09 NOTE — Patient Instructions (Addendum)
Stay on Dexilant  once dailly.  Take Carafate and Reglan as needed.  Follow up in 1 year, sooner if needed.  If you are age 42 or older, your body mass index should be between 23-30. Your Body mass index is 23.6 kg/m. If this is out of the aforementioned range listed, please consider follow up with your Primary Care Provider.  If you are age 67 or younger, your body mass index should be between 19-25. Your Body mass index is 23.6 kg/m. If this is out of the aformentioned range listed, please consider follow up with your Primary Care Provider.

## 2017-10-10 ENCOUNTER — Encounter: Payer: Self-pay | Admitting: Internal Medicine

## 2017-10-10 NOTE — Progress Notes (Signed)
   Subjective:    Patient ID: Amy Carney, female    DOB: 1975/09/19, 42 y.o.   MRN: [id number]  HPI Amy Carney is a 42 year old female with a history of GERD, small hiatal hernia who is here for follow-up.  She was last seen in February 2019 at which time she was having dyspeptic type symptoms with upper abdominal pressure and discomfort.  Today she reports that she is doing very well.  Her upper GI symptoms are largely controlled and for the most part absent.  She has significantly reduced her daily caffeine intake.  She is continue Dexilant 60 mg daily.  She is off Carafate though did previously find this helpful.  I prescribed Reglan for very limited use and she is only use this once since being seen last.  She used it for upper abdominal pressure when it was most severe and it seemed to help.  She is off of probiotics as well.  Her bowel movements have been regular without diarrhea or constipation.  She is also had improvement in her migraines.  Review of Systems As per HPI, otherwise negative  Current Medications, Allergies, Past Medical History, Past Surgical History, Family History and Social History were reviewed in [employer] record.     Objective:   Physical Exam BP 94/60   Pulse 72   Ht 6' (1.829 m)   Wt 174 lb (78.9 kg)   BMI 23.60 kg/m  Constitutional: Well-developed and well-nourished. No distress. Psychiatric: Normal mood and affect. Behavior is normal.     Assessment & Plan:   42 year old female with a history of GERD, small hiatal hernia who is here for follow-up.   1.  GERD/dyspepsia with epigastric pressure --doing well with dietary modification and daily Dexilant 60 mg daily.  We did discuss the risks, benefits and alternatives to chronic PPI.  We discussed the importance of monitoring bone health on chronic PPI.  I recommended that she consider calcium supplementation and that she discuss this further with her primary provider.  She can  continue Carafate 1 g 3 times daily before meals and at bedtime on an as-needed basis as well as metoclopramide 10 mg twice daily as needed.  She will use metoclopramide on a very infrequent basis.  2.  B12 deficiency --continue IM B12 monthly  Annual follow-up, sooner if needed 15 minutes spent with the patient today. Greater than 50% was spent in counseling and coordination of care with the patient

## 2018-01-08 ENCOUNTER — Other Ambulatory Visit: Payer: Self-pay | Admitting: *Deleted

## 2018-01-08 MED ORDER — DEXLANSOPRAZOLE 60 MG PO CPDR: 60.0000 mg | DELAYED_RELEASE_CAPSULE | Freq: Every day | ORAL | 1 refills | Status: DC

## 2018-06-24 ENCOUNTER — Other Ambulatory Visit: Payer: Self-pay | Admitting: Specialist

## 2018-06-24 DIAGNOSIS — Z1231 Encounter for screening mammogram for malignant neoplasm of breast: Secondary | ICD-10-CM

## 2018-07-22 ENCOUNTER — Ambulatory Visit: Payer: BLUE CROSS/BLUE SHIELD

## 2018-08-08 ENCOUNTER — Inpatient Hospital Stay: Admission: RE | Admit: 2018-08-08 | Payer: BLUE CROSS/BLUE SHIELD | Source: Ambulatory Visit

## 2018-10-02 ENCOUNTER — Telehealth: Payer: Self-pay | Admitting: Internal Medicine

## 2018-10-02 MED ORDER — DEXLANSOPRAZOLE 60 MG PO CPDR: 60.0000 mg | DELAYED_RELEASE_CAPSULE | Freq: Every day | ORAL | 0 refills | Status: DC

## 2018-10-02 NOTE — Telephone Encounter (Signed)
Rx sent 

## 2018-10-21 ENCOUNTER — Ambulatory Visit
Admission: RE | Admit: 2018-10-21 | Discharge: 2018-10-21 | Disposition: A | Discharge status: Home / Self Care | Payer: 59 | Source: Ambulatory Visit | Attending: Specialist | Admitting: Specialist

## 2018-10-21 ENCOUNTER — Other Ambulatory Visit: Payer: Self-pay | Admitting: Specialist

## 2018-10-21 ENCOUNTER — Other Ambulatory Visit: Payer: Self-pay

## 2018-10-21 ENCOUNTER — Telehealth: Payer: Self-pay | Admitting: Internal Medicine

## 2018-10-21 ENCOUNTER — Encounter: Payer: Self-pay | Admitting: *Deleted

## 2018-10-21 DIAGNOSIS — N644 Mastodynia: Secondary | ICD-10-CM

## 2018-10-21 DIAGNOSIS — Z1231 Encounter for screening mammogram for malignant neoplasm of breast: Secondary | ICD-10-CM

## 2018-10-21 NOTE — Telephone Encounter (Signed)
Spoke with patient and went over medications and history prior to her appointment with Dr Rhea Belton tomorrow.

## 2018-10-21 NOTE — Telephone Encounter (Signed)
Pt returned your call.  

## 2018-10-21 NOTE — Telephone Encounter (Signed)
Left message for patient to call back  

## 2018-10-22 ENCOUNTER — Ambulatory Visit (INDEPENDENT_AMBULATORY_CARE_PROVIDER_SITE_OTHER): Payer: 59 | Admitting: Internal Medicine

## 2018-10-22 ENCOUNTER — Encounter: Payer: Self-pay | Admitting: Internal Medicine

## 2018-10-22 VITALS — Ht 72.0 in | Wt 178.0 lb

## 2018-10-22 DIAGNOSIS — R1013 Epigastric pain: Secondary | ICD-10-CM | POA: Diagnosis not present

## 2018-10-22 DIAGNOSIS — K219 Gastro-esophageal reflux disease without esophagitis: Secondary | ICD-10-CM

## 2018-10-22 DIAGNOSIS — K449 Diaphragmatic hernia without obstruction or gangrene: Secondary | ICD-10-CM | POA: Diagnosis not present

## 2018-10-22 NOTE — Progress Notes (Signed)
Subjective:    Patient ID: Amy Carney, female    DOB: 01/17/1976, 43 y.o.   MRN: [id number]  This service was provided via telemedicine.   Doximity app with AV communication The patient was located at home The provider was located in provider's GI office. The patient did consent to this telephone visit and is aware of possible charges through their insurance for this visit.   The persons participating in this telemedicine service were the patient and I. Time spent on call: 24 min   HPI Amy Carney is a 43 year old female with a history of GERD, small hiatal hernia, dyspeptic epigastric discomfort/pressure who is seen for follow-up.  She was last seen on 10/09/2017.  She has been maintained on Dexilant 60 mg daily.  She rarely uses Reglan if she has significant upper abdominal pressure symptom and in the past has used Carafate with some relief.  She is using Carafate infrequently as well.  She is seen today virtually by the Doximity app with A/V communication in the setting of COVID-19.  She reports that she has been feeling well.  Her husband, unfortunately, had COVID-19 but fortunately he recovered.  The rest of the family did not have known COVID-19 symptoms though she reports none of them felt very well during that time.  From a reflux perspective she is having some heartburn which is worse with aggravating foods.  She continues to limit caffeine and other known triggers.  She is taking Dexilant 60 mg a day and despite this will still have intermittent heartburn symptoms, globus sensation and throat clearing.  No dysphagia or odynophagia.  Still intermittent epigastric pressure sensation that feels like it is an upward sensation up into her chest.  No significant nausea or vomiting.  No lower abdominal pain, change in bowel habit, diarrhea, constipation, blood in her stool or melena.  She did have a primary care office visit this year and a bone density test yesterday.  She is concerned  about chronic PPI would like to be off therapy if possible.  She was discovered to have very low estrogen levels which also concerns her regarding bone health.  She had an upper endoscopy and colonoscopy in July 2017.  Upper endoscopy showed some mild gastritis with unremarkable biopsies.  Small hiatal hernia.  No esophagitis at that time.  Colonoscopy was normal.    Review of Systems As per HPI, otherwise negative  Current Medications, Allergies, Past Medical History, Past Surgical History, Family History and Social History were reviewed in [facility] electronic medical record.     Objective:   Physical Exam No physical exam, virtual visit  Labs reviewed from care everywhere.  Vitamin D 2 1, D3 33, vitamin D 25 hydroxy 34, B12 386, estradiol less than 15 White count 4.5, hemoglobin 12.9, MCV 94.2, platelet count 195 CMP was normal with the exception of a slightly elevated CO2 at 31 TSH was 0.676     Assessment & Plan:  43 year old female with a history of GERD, small hiatal hernia, dyspeptic epigastric discomfort/pressure who is seen for follow-up.   1.  GERD with dyspeptic symptoms/epigastric pressure --we discussed her symptoms today.  It does sound like her reflux may be uncontrolled particularly with breakthrough heartburn, globus and throat clearing.  We discussed LPR today.  She is on Dexilant 60 mg daily and despite this is having symptoms.  Her ultimate goal would be to be off PPI therapy.  We discussed this at length today.  We also  discussed antireflux maneuvers particularly TIF which she may be an excellent candidate for 2 eliminate reflux and allow her to be off of PPI therapy.  Finally we also discussed the addition of amitriptyline or buspirone for her epigastric pressure discomfort symptom as these medications can help and dyspepsia.  For now I recommended the following: --Continue Dexilant 60 mg daily --Add Pepcid 20 mg nightly; let me know in 2 to 3 weeks response  to this therapy.  Consider amitriptyline or buspirone if not improving --Follow-up bone scan by primary care --Antireflux diet and lifestyle medications --Referral to Dr. Barron Alvine to discuss and evaluate her candidacy for TIF; she requested that any objective testing be delayed until after 11/20/2018 when her medical insurance deductible will reset.  She would be open to an office visit before that, if available.

## 2018-10-22 NOTE — Patient Instructions (Addendum)
Continue Dexilant 60 mg daily.  Please purchase the following medications over the counter and take as directed: Pepcid 20 mg-take nightly; let us know in 2 to 3 weeks your response to this therapy.    Follow-up bone scan by primary care  Continue to follow antireflux diet and lifestyle medications.  You have been scheduled for a virtual (Doximity) appointment with Dr Doristine Locks on Tuesday 11/26/18 at 3:30 pm

## 2018-10-29 ENCOUNTER — Ambulatory Visit: Payer: 59 | Admitting: Gastroenterology

## 2018-10-31 ENCOUNTER — Other Ambulatory Visit: Payer: Self-pay

## 2018-10-31 ENCOUNTER — Ambulatory Visit
Admission: RE | Admit: 2018-10-31 | Discharge: 2018-10-31 | Disposition: A | Discharge status: Home / Self Care | Payer: 59 | Source: Ambulatory Visit | Attending: Specialist | Admitting: Specialist

## 2018-10-31 DIAGNOSIS — N644 Mastodynia: Secondary | ICD-10-CM

## 2018-11-26 ENCOUNTER — Encounter: Payer: Self-pay | Admitting: Gastroenterology

## 2018-11-26 ENCOUNTER — Telehealth (INDEPENDENT_AMBULATORY_CARE_PROVIDER_SITE_OTHER): Payer: 59 | Admitting: Gastroenterology

## 2018-11-26 ENCOUNTER — Other Ambulatory Visit: Payer: Self-pay

## 2018-11-26 VITALS — Ht 72.0 in | Wt 178.0 lb

## 2018-11-26 DIAGNOSIS — K449 Diaphragmatic hernia without obstruction or gangrene: Secondary | ICD-10-CM

## 2018-11-26 DIAGNOSIS — K219 Gastro-esophageal reflux disease without esophagitis: Secondary | ICD-10-CM | POA: Diagnosis not present

## 2018-11-26 NOTE — Patient Instructions (Signed)
If you are age 43 or older, your body mass index should be between 23-30. Your Body mass index is 24.14 kg/m. If this is out of the aforementioned range listed, please consider follow up with your Primary Care Provider.  If you are age 84 or younger, your body mass index should be between 19-25. Your Body mass index is 24.14 kg/m. If this is out of the aformentioned range listed, please consider follow up with your Primary Care Provider.   To help prevent the possible spread of infection to our patients, communities, and staff; we will be implementing the following measures:  As of now we are not allowing any visitors/family members to accompany you to any upcoming appointments with [facility]. If you have any concerns about this please contact our office to discuss prior to the appointment.   You have been scheduled for an endoscopy. Please follow written instructions given to you at your visit today. If you use inhalers (even only as needed), please bring them with you on the day of your procedure. Your physician has requested that you go to www.startemmi.com and enter the access code given to you at your visit today. This web site gives a general overview about your procedure. However, you should still follow specific instructions given to you by our office regarding your preparation for the procedure.  PLEASE HOLD YOUR DEXILANT FOR 2 DAYS PRIOR TO SCHEDULED EGD.  It was a pleasure to see you today!  Vito Cirigliano, D.O.

## 2018-11-26 NOTE — Progress Notes (Signed)
Chief Complaint: GERD, evaluation for TIF  Referring Provider:     Erick BlinksPyrtle, Jay, MD  HPI:    Due to current restrictions/limitations of in-office visits due to the COVID-19 pandemic, this scheduled clinical appointment was converted to a telehealth virtual consultation using Doximity.  -Time of medical discussion: 30 minutes -The patient did consent to this virtual visit and is aware of possible charges through their insurance for this visit.  -Names of all parties present: Amy HertzJenny V Carney (patient), Doristine LocksVito Bellanie Matthew, DO, [facility] (physician) -Patient location: Work -Physician location: Office  Amy Carney is a 43 y.o. female referred to me by Dr. Rhea BeltonPyrtle for evaluation of possible antireflux intervention with Transoral Incisionless Fundoplication (TIF) with a goal to stop or significantly reduce acid suppression therapy.  She has had a longstanding history of reflux, characterized by heartburn, globus sensation, increased throat clearing, dyspepsia, and increased epigastric pressure.  Starting to have increased frequency and breakthrough symptoms despite ongoing Dexilant.  Added Pepcid last month with minimal improvement. Rare use of Carafate. Sleeps with HOB elevated.  No dysphagia.  GERD history: -Index symptoms: Heartburn, globus sensation, throat clearing, dyspepsia, epigastric pressure -Medications trialed: Pantoprazole, Zantac, -Current medications: Dexilant 60 mg/day, Pepcid 20 mg/night; rare Reglan, rare Carafate -Complications: Small HH -Exacerbated by: Caffeine  GERD evaluation: -EGD: 2017: Mild gastritis, small HH -Barium esophagram: None -Esophageal Manometry: None -pH/Impedance: None  FHx n/f PGM with Barrett's Esophagus.   Past medical history, past surgical history, social history, family history, medications, and allergies reviewed in the chart and with patient.    Past Medical History:  Diagnosis Date  . Anemia   . Anxiety   . Bilateral neck pain    . Chronic migraine without aura without status migrainosus, not intractable   . Fatigue   . Gastritis   . GERD (gastroesophageal reflux disease)   . Hiatal hernia   . Internal hemorrhoids   . Neuralgia   . Recurrent major depression in remission (HCC)   . Sinusitis   . Skin tag of perianal region   . Weakness      Past Surgical History:  Procedure Laterality Date  . ANAL SPHINCTEROTOMY    . BREAST BIOPSY Right 03/07/2016  . CESAREAN SECTION    . DILATION AND CURETTAGE OF UTERUS    . ENDOMETRIAL ABLATION    . LAPAROSCOPY    . NASAL SINUS SURGERY    . TONSILLECTOMY    . TUBAL LIGATION     Family History  Problem Relation Age of Onset  . Colon cancer Other        Great Maternal Grandmother  . Barrett's esophagus Paternal Grandmother   . Osteoporosis Paternal Grandmother   . Migraines Mother   . Skin cancer Mother   . Hypertension Maternal Grandmother   . Lymphoma Maternal Grandfather   . Stroke Paternal Grandfather   . Stomach cancer Neg Hx   . Pancreatic cancer Neg Hx    Social History   Tobacco Use  . Smoking status: Former Smoker    Quit date: 2003    Years since quitting: 17.5  . Smokeless tobacco: Never Used  Substance Use Topics  . Alcohol use: Yes    Alcohol/week: 0.0 standard drinks    Comment: Social  . Drug use: No   Current Outpatient Medications  Medication Sig Dispense Refill  . aspirin-acetaminophen-caffeine (EXCEDRIN MIGRAINE) 250-250-65 MG tablet Take by mouth every 8 (eight) hours as  needed for headache.    . Cholecalciferol (VITAMIN D3) 25 MCG TABS Take 3 tablets by mouth daily.     . Cyanocobalamin (VITAMIN B-12) 1000 MCG/15ML LIQD Inject 1,000 mcg into the skin every 30 (thirty) days.    Marland Kitchen. dexlansoprazole (DEXILANT) 60 MG capsule Take 1 capsule (60 mg total) by mouth daily. PLEASE KEEP 10/2018 APPT FOR FURTHER REFILLS 180 capsule 0  . EVENING PRIMROSE OIL PO Take by mouth daily.    Marland Kitchen. ibuprofen (ADVIL,MOTRIN) 200 MG tablet Take 200 mg by  mouth every 8 (eight) hours as needed.    . metoCLOPramide (REGLAN) 10 MG tablet Take 1 tablet (10 mg total) by mouth 2 (two) times daily as needed. 30 tablet 1  . sucralfate (CARAFATE) 1 g tablet Take 1 tablet (1 g total) by mouth 4 (four) times daily -  before meals and at bedtime. Make into slurry (Patient taking differently: Take 1 g by mouth 4 (four) times daily -  before meals and at bedtime. Make into slurry---use as needed) 120 tablet 1   No current facility-administered medications for this visit.    Allergies  Allergen Reactions  . Levaquin  [Levofloxacin In D5w]   . Topamax [Topiramate] Other (See Comments)    Numbness    . Penicillins Hives and Rash  . Sulfa Antibiotics Hives and Rash     Review of Systems: All systems reviewed and negative except where noted in HPI.     Physical Exam:    Complete physical exam not completed due to the nature of this telehealth communication.   Gen: Awake, alert, and oriented, and well communicative. HEENT: EOMI, non-icteric sclera, NCAT, MMM Neck: Normal movement of head and neck Pulm: No labored breathing, speaking in full sentences without conversational dyspnea Derm: No apparent lesions or bruising in visible field MS: Moves all visible extremities without noticeable abnormality Psych: Pleasant, cooperative, normal speech, thought processing seemingly intact   ASSESSMENT AND PLAN;   1) GERD 2) Hiatal hernia  Amy Carney is a 43 y.o. female referred to me to discuss GERD and treatment options. Discussed the pathophysiology of GERD at length, to include the risks of untreated reflux (ie, strictures, Barrett's Esophagus, EAC, etc) as well as the possible treatment with medications vs antireflux surgery. In particular, we discussed the risks, benefits, and alternatives of Transoral Incisionless Fundoplication (TIF), to include Nissen fundoplication and Linx, and the patient wishes to proceed with the pre-operative evaluation for  TIF. Will proceed as below:   - EGD to evaluate for objective e/o reflux (ie, reflux esophagitis, SSND Barrett's, etc) and r/o contraindications of TIF (ie, large hiatal hernia, Hill classification 3 or higher, LA Grade C or D esophagitis, EoE, etc)  - Resume acid suppression therapy for now  - If necessary, may need to refer for pH/MII testing and/or barium esophagram - Discussed the strict post-procedure diet, to include clears x24 hours then liquids x2 weeks and slow advancement to pureed, thick, then finally previous foods 6 weeks post operatively  - Discussed the activity limitations for the initial 6 weeks post operatively  -Can forward link to informative video regarding the TIF procedure at [email or web address]  - To follow-up with me in the GI clinic following evaluation as above to discuss potential TIF as indicated vs alternate long term treatment strategies - All questions answered  3) Upper abdominal pressure: Difficult to ascertain whether or not this is an atypical manifestation of reflux, concomitant functional disorder, or atypical esophageal hypersensitivity.  Briefly  discussed the role of amitriptyline and other neuro modulating medications.  Will hold off on trial of that for now in favor of the above reflux work-up.  Did discuss that is possible that we complete a TIF with successful control of her reflux but ongoing pressure type pain, in which case we will likely treat with neuromodulation.  She was very agreeable to this approach.  The indications, risks, and benefits of EGD were explained to the patient in detail. Risks include but are not limited to bleeding, perforation, adverse reaction to medications, and cardiopulmonary compromise. Sequelae include but are not limited to the possibility of surgery, hositalization, and mortality. The patient verbalized understanding and wished to proceed. All questions answered, referred to scheduler. Further recommendations pending  results of the exam.    I spent over 25 minutes of virtual face-to-face time with the patient. Greater than 50% of the time was spent counseling and coordinating care.     Lavena Bullion, DO, FACG  11/26/2018, 3:33 PM  Cc: Zenovia Jarred, MD [facility]*

## 2018-12-02 ENCOUNTER — Telehealth: Payer: Self-pay | Admitting: Gastroenterology

## 2018-12-02 NOTE — Telephone Encounter (Signed)
Pt stated that she is returning your call.  °

## 2018-12-10 ENCOUNTER — Telehealth: Payer: Self-pay | Admitting: Gastroenterology

## 2018-12-10 NOTE — Telephone Encounter (Signed)

## 2018-12-11 ENCOUNTER — Ambulatory Visit (AMBULATORY_SURGERY_CENTER): Payer: 59 | Admitting: Gastroenterology

## 2018-12-11 ENCOUNTER — Other Ambulatory Visit: Payer: Self-pay

## 2018-12-11 ENCOUNTER — Encounter: Payer: Self-pay | Admitting: Gastroenterology

## 2018-12-11 VITALS — BP 116/65 | HR 69 | Temp 98.4°F | Resp 12 | Ht 72.0 in | Wt 178.0 lb

## 2018-12-11 DIAGNOSIS — K449 Diaphragmatic hernia without obstruction or gangrene: Secondary | ICD-10-CM | POA: Diagnosis not present

## 2018-12-11 DIAGNOSIS — K2970 Gastritis, unspecified, without bleeding: Secondary | ICD-10-CM | POA: Diagnosis not present

## 2018-12-11 DIAGNOSIS — K219 Gastro-esophageal reflux disease without esophagitis: Secondary | ICD-10-CM

## 2018-12-11 MED ORDER — SODIUM CHLORIDE 0.9 % IV SOLN: 500.0000 mL | Freq: Once | INTRAVENOUS | Status: DC

## 2018-12-11 NOTE — Op Note (Signed)
[facility] Patient Name: Amy Carney Procedure Date: 12/11/2018 8:40 AM MRN: [id number] Endoscopist: Doristine LocksVito Sanvika Cuttino , MD Age: [age] Referring MD:  Date of Birth: 01/29/1976 Gender: Female Account #: [id number] Procedure:                Upper GI endoscopy Indications:              Heartburn, Esophageal reflux, Preoperative                            assessment for possible Transoral Incisionless                            Fundoplication (TIF)                           43 yo female with longstanding history of reflux,                            characterized by heartburn, globus sensation,                            increased throat clearing, dyspepsia, and increased                            epigastric pressure. Starting to have increased                            frequency and breakthrough symptoms despite ongoing                            Dexilant. Added Pepcid recently with minimal                            improvement. She presents today for preoperative                            assessment for possible Transoral Incisionless                            Fundoplication (TIF). Medicines:                Monitored Anesthesia Care Procedure:                Pre-Anesthesia Assessment:                           - Prior to the procedure, a History and Physical                            was performed, and patient medications and                            allergies were reviewed. The patient's tolerance of                            previous anesthesia was also reviewed. The risks  and benefits of the procedure and the sedation                            options and risks were discussed with the patient.                            All questions were answered, and informed consent                            was obtained. Prior Anticoagulants: The patient has                            taken no previous anticoagulant or antiplatelet          agents. ASA Grade Assessment: II - A patient with                            mild systemic disease. After reviewing the risks                            and benefits, the patient was deemed in                            satisfactory condition to undergo the procedure.                           After obtaining informed consent, the endoscope was                            passed under direct vision. Throughout the                            procedure, the patient's blood pressure, pulse, and                            oxygen saturations were monitored continuously. The                            Model GIF-[id number]) scope was introduced                            through the mouth, and advanced to the second part                            of duodenum. The upper GI endoscopy was                            accomplished without difficulty. The patient                            tolerated the procedure well. Scope In: Scope Out: Findings:                 The upper third of the esophagus, middle third of  the esophagus and lower third of the esophagus were                            normal.                           The Z-line was regular and was found 43 cm from the                            incisors.                           The gastroesophageal flap valve was visualized                            endoscopically and classified as Hill Grade II                            (fold present, opens with respiration).                           A <1 cm sliding type acial hiatal hernia was                            present. Retroflexed views only notable for mild                            LES laxity with <1 cm transverse width hernia.                           Localized mild inflammation characterized by                            erythema was found in the gastric body. Biopsies                            were taken with a cold forceps for Helicobacter                             pylori testing. Estimated blood loss was minimal.                           The duodenal bulb, first portion of the duodenum                            and second portion of the duodenum were normal. Complications:            No immediate complications. Estimated Blood Loss:     Estimated blood loss was minimal. Impression:               - Normal upper third of esophagus, middle third of                            esophagus and lower third of esophagus.                           -  Z-line regular, 43 cm from the incisors.                           - Gastroesophageal flap valve classified as Hill                            Grade II (fold present, opens with respiration).                           - 1 cm hiatal hernia.                           - Gastritis. Biopsied.                           - Normal duodenal bulb, first portion of the                            duodenum and second portion of the duodenum.                           - No erosive esophagitis noted on this study.                            Recommend further evaluation with an upper GI                            series to evaluate for presence/severity of                            refluxate (vs pH/Impedance study, done off PPI                            therapy x5 days) as part of the pre-operative                            assessment for TIF. Recommendation:           - Patient has a contact number available for                            emergencies. The signs and symptoms of potential                            delayed complications were discussed with the                            patient. Return to normal activities tomorrow.                            Written discharge instructions were provided to the                            patient.                           -  Resume previous diet.                           - Continue present medications.                           - Await pathology results.                            - Do an upper GI series at appointment to be                            scheduled.                           - Pending results of upper GI series, will plan for                            either further evaluation vs scheduling for TIF.                           - Will follow-up results with Dr. Barron Alvineirigliano. Doristine LocksVito Jarell Mcewen, MD 12/11/2018 9:13:51 AM

## 2018-12-11 NOTE — Progress Notes (Signed)
PT taken to PACU. Monitors in place. VSS. Report given to RN. 

## 2018-12-11 NOTE — Progress Notes (Signed)
Pt's states no medical or surgical changes since previsit or office visit.  covid-19 screen and temp by Courtney  Vs in admitting by Bethena Roys

## 2018-12-11 NOTE — Progress Notes (Signed)
Called to room to assist during endoscopic procedure.  Patient ID and intended procedure confirmed with present staff. Received instructions for my participation in the procedure from the performing physician.  

## 2018-12-11 NOTE — Patient Instructions (Signed)
Please read handouts provided. Continue present medications. Await pathology results. Do an upper GI series at appointment to be scheduled.      YOU HAD AN ENDOSCOPIC PROCEDURE TODAY AT [facility]:   Refer to the procedure report that was given to you for any specific questions about what was found during the examination.  If the procedure report does not answer your questions, please call your gastroenterologist to clarify.  If you requested that your care partner not be given the details of your procedure findings, then the procedure report has been included in a sealed envelope for you to review at your convenience later.  YOU SHOULD EXPECT: Some feelings of bloating in the abdomen. Passage of more gas than usual.  Walking can help get rid of the air that was put into your GI tract during the procedure and reduce the bloating. If you had a lower endoscopy (such as a colonoscopy or flexible sigmoidoscopy) you may notice spotting of blood in your stool or on the toilet paper. If you underwent a bowel prep for your procedure, you may not have a normal bowel movement for a few days.  Please Note:  You might notice some irritation and congestion in your nose or some drainage.  This is from the oxygen used during your procedure.  There is no need for concern and it should clear up in a day or so.  SYMPTOMS TO REPORT IMMEDIATELY:     Following upper endoscopy (EGD)  Vomiting of blood or coffee ground material  New chest pain or pain under the shoulder blades  Painful or persistently difficult swallowing  New shortness of breath  Fever of 100F or higher  Black, tarry-looking stools  For urgent or emergent issues, a gastroenterologist can be reached at any hour by calling [phone].   DIET:  We do recommend a small meal at first, but then you may proceed to your regular diet.  Drink plenty of fluids but you should avoid alcoholic beverages for 24 hours.  ACTIVITY:   You should plan to take it easy for the rest of today and you should NOT DRIVE or use heavy machinery until tomorrow (because of the sedation medicines used during the test).    FOLLOW UP: Our staff will call the number listed on your records 48-72 hours following your procedure to check on you and address any questions or concerns that you may have regarding the information given to you following your procedure. If we do not reach you, we will leave a message.  We will attempt to reach you two times.  During this call, we will ask if you have developed any symptoms of COVID 19. If you develop any symptoms (ie: fever, flu-like symptoms, shortness of breath, cough etc.) before then, please call [phone].  If you test positive for Covid 19 in the 2 weeks post procedure, please call and report this information to [place].    If any biopsies were taken you will be contacted by phone or by letter within the next 1-3 weeks.  Please call us at [phone] if you have not heard about the biopsies in 3 weeks.    SIGNATURES/CONFIDENTIALITY: You and/or your care partner have signed paperwork which will be entered into your electronic medical record.  These signatures attest to the fact that that the information above on your After Visit Summary has been reviewed and is understood.  Full responsibility of the confidentiality of this discharge information lies with  you and/or your care-partner.

## 2018-12-13 ENCOUNTER — Telehealth: Payer: Self-pay

## 2018-12-13 DIAGNOSIS — K219 Gastro-esophageal reflux disease without esophagitis: Secondary | ICD-10-CM

## 2018-12-13 DIAGNOSIS — K449 Diaphragmatic hernia without obstruction or gangrene: Secondary | ICD-10-CM

## 2018-12-13 DIAGNOSIS — K2970 Gastritis, unspecified, without bleeding: Secondary | ICD-10-CM

## 2018-12-13 NOTE — Telephone Encounter (Signed)
Plan per procedure report stated: Do an upper GI series at appointment to be scheduled. - Pending results of upper GI series, will plan for either further evaluation vs scheduling for TIF. - Will follow-up results with Dr. Bryan Lemma.  Patient has no appt or order in place at this time for any of the above; on the paper procedure report the  "upper gi series at appt to be scheduled" is crossed out?   Please advise if this patient needs to be scheduled for an upper gi series or is the patient completed requested order at alternative facility? Does a f/u appt need to be made?

## 2018-12-13 NOTE — Telephone Encounter (Signed)
  Follow up Call-  Call back number 12/11/2018  Post procedure Call Back phone  # [phone]  Permission to leave phone message Yes  Some recent data might be hidden     Patient questions:  Do you have a fever, pain , or abdominal swelling? No. Pain Score  0 *  Have you tolerated food without any problems? Yes.    Have you been able to return to your normal activities? Yes.    Do you have any questions about your discharge instructions: Diet   No. Medications  No. Follow up visit  No.  Do you have questions or concerns about your Care? No.  Actions: * If pain score is 4 or above: No action needed, pain <4. 1. Have you developed a fever since your procedure? no  2.   Have you had an respiratory symptoms (SOB or cough) since your procedure? no  3.   Have you tested positive for COVID 19 since your procedure no  4.   Have you had any family members/close contacts diagnosed with the COVID 19 since your procedure?  no   If yes to any of these questions please route to Joylene John, RN and Alphonsa Gin, Therapist, sports.

## 2018-12-14 NOTE — Telephone Encounter (Signed)
The patient decided to proceed with EM and pH/Impedance instead of the UGI series. So yes, can cancel the UGI and instead plan for an EM and pH/Impedance OFF PPI therapy x5 days. Thanks.

## 2018-12-16 NOTE — Telephone Encounter (Signed)
Patient has been scheduled for an esophageal manometry on 01/06/2019 at 8:30 am; patient is to hold PPI for 5 days prior to the test; a MyChart message was sent to the patient to inform her of this information;  Instructions were also mailed to the patient concerning this test;

## 2018-12-17 ENCOUNTER — Encounter: Payer: Self-pay | Admitting: Gastroenterology

## 2018-12-17 NOTE — Telephone Encounter (Signed)
Patient will need to have COVID-19 testing done prior to her procedure on 01/06/2019-attempted to reach patient to inform her of this testing-left message on VM for patient to return call to the office Instructions have been sent via MyChart and mail to the patient of appt for COVID testing on 01/02/2019 at 9:50 am;

## 2018-12-18 NOTE — Telephone Encounter (Signed)
Left message for patient to call back to the office;  

## 2018-12-19 NOTE — Telephone Encounter (Signed)
Patient returned call to the office-patient was informed of eso mano with pH impe being scheduled on 01/06/2019 at 8:30 am; patient was also informed of COVID testing being needed on 01/02/2019 for pre procedure testing; patient was informed instructions have been mailed to her and that she can also access the instructions on MyChart; patient verbalized understanding of information/instructions; patient advised to call back to the office should questions/concerns arise;

## 2019-01-02 ENCOUNTER — Other Ambulatory Visit (HOSPITAL_COMMUNITY)
Admission: RE | Admit: 2019-01-02 | Discharge: 2019-01-02 | Disposition: A | Discharge status: Home / Self Care | Payer: 59 | Source: Ambulatory Visit | Attending: Gastroenterology | Admitting: Gastroenterology

## 2019-01-02 DIAGNOSIS — Z01812 Encounter for preprocedural laboratory examination: Secondary | ICD-10-CM | POA: Insufficient documentation

## 2019-01-02 DIAGNOSIS — Z20828 Contact with and (suspected) exposure to other viral communicable diseases: Secondary | ICD-10-CM | POA: Insufficient documentation

## 2019-01-02 LAB — SARS CORONAVIRUS 2 (TAT 6-24 HRS): SARS Coronavirus 2: NEGATIVE

## 2019-01-06 ENCOUNTER — Ambulatory Visit (HOSPITAL_COMMUNITY)
Admission: RE | Admit: 2019-01-06 | Discharge: 2019-01-06 | Disposition: A | Discharge status: Home / Self Care | Payer: 59 | Source: Ambulatory Visit | Attending: Gastroenterology | Admitting: Gastroenterology

## 2019-01-06 ENCOUNTER — Encounter (HOSPITAL_COMMUNITY)
Admission: RE | Disposition: A | Discharge status: Home / Self Care | Payer: Self-pay | Source: Ambulatory Visit | Attending: Gastroenterology

## 2019-01-06 ENCOUNTER — Encounter (HOSPITAL_COMMUNITY): Payer: Self-pay

## 2019-01-06 DIAGNOSIS — K219 Gastro-esophageal reflux disease without esophagitis: Secondary | ICD-10-CM | POA: Diagnosis present

## 2019-01-06 HISTORY — PX: ESOPHAGEAL MANOMETRY: SHX5429

## 2019-01-06 HISTORY — PX: PH IMPEDANCE STUDY: SHX5565

## 2019-01-06 SURGERY — MANOMETRY, ESOPHAGUS
Anesthesia: Choice

## 2019-01-06 MED ORDER — LIDOCAINE VISCOUS HCL 2 % MT SOLN
OROMUCOSAL | Status: AC
  Filled 2019-01-06: qty 15

## 2019-01-06 SURGICAL SUPPLY — 2 items
FACESHIELD LNG OPTICON STERILE (SAFETY) IMPLANT
GLOVE BIO SURGEON STRL SZ8 (GLOVE) ×4 IMPLANT

## 2019-01-06 NOTE — Progress Notes (Signed)
Esophageal manometry done per protocol.  Patient tolerated well. PH probe inserted per protocol.  Patient education given, patient verb understanding.  Patient tolerated well. Probe placed at 38 cm at Left nare.  Patient to return tomorrow at 0930 for removal of probe.

## 2019-01-07 ENCOUNTER — Encounter (HOSPITAL_COMMUNITY): Payer: Self-pay | Admitting: Gastroenterology

## 2019-01-15 ENCOUNTER — Other Ambulatory Visit: Payer: Self-pay | Admitting: Internal Medicine

## 2019-01-16 DIAGNOSIS — K219 Gastro-esophageal reflux disease without esophagitis: Secondary | ICD-10-CM

## 2019-02-06 NOTE — Telephone Encounter (Signed)
Called and spoke with patient-patient informed of result note and MD recommendations; patient is agreeable with plan of care and was scheduled for a follow up [place] with Dr. Bryan Lemma on 02/28/2019 at 8:40 am; Patient verbalized understanding of information/instructions;  Patient was advised to call the office at [phone] if questions/concerns arise;

## 2019-02-28 ENCOUNTER — Ambulatory Visit (INDEPENDENT_AMBULATORY_CARE_PROVIDER_SITE_OTHER): Payer: 59 | Admitting: Gastroenterology

## 2019-02-28 ENCOUNTER — Other Ambulatory Visit: Payer: Self-pay

## 2019-02-28 ENCOUNTER — Encounter: Payer: Self-pay | Admitting: Gastroenterology

## 2019-02-28 VITALS — Ht 72.0 in | Wt 180.0 lb

## 2019-02-28 DIAGNOSIS — K449 Diaphragmatic hernia without obstruction or gangrene: Secondary | ICD-10-CM | POA: Diagnosis not present

## 2019-02-28 DIAGNOSIS — K30 Functional dyspepsia: Secondary | ICD-10-CM

## 2019-02-28 DIAGNOSIS — R12 Heartburn: Secondary | ICD-10-CM | POA: Diagnosis not present

## 2019-02-28 MED ORDER — VENLAFAXINE HCL 25 MG PO TABS: 25.0000 mg | ORAL_TABLET | Freq: Every day | ORAL | 2 refills | Status: DC

## 2019-02-28 NOTE — Patient Instructions (Signed)
If you are age 43 or older, your body mass index should be between 23-30. Your Body mass index is 24.41 kg/m. If this is out of the aforementioned range listed, please consider follow up with your Primary Care Provider.  If you are age 73 or younger, your body mass index should be between 19-25. Your Body mass index is 24.41 kg/m. If this is out of the aformentioned range listed, please consider follow up with your Primary Care Provider.   We have sent the following medications to your pharmacy for you to pick up at your convenience: Effexor 25 mg at bedtime.   Please purchase the following medications over the counter and take as directed: FD Guard   Follow up in 3 months.  It was a pleasure to see you today!  Vito Cirigliano, D.O.

## 2019-02-28 NOTE — Progress Notes (Signed)
P  Chief Complaint:    Heartburn, Hiatal hernia, review test results  GI History: 43 year old female with a longstanding history of reflux symptoms, characterized by heartburn, globus sensation, increased throat clearing, dyspepsia, and increased epigastric pressure, referred to me in 11/2018 for evaluation for TIF.  At that time, endorsed increased frequency and breakthrough symptoms despite ongoing Dexilant, added Pepcid with minimal improvement.  Was further evaluated with EGD and EM/pH/impedance, as outlined below, and diagnosed with Esophageal Hypersensitivity.  GERD evaluation: -Last EGD:  11/2018 -Barium esophagram: None -Esophageal Manometry:  12/2018: Normal -pH/Impedance:  12/2018: Normal, JD 2.3, +SAP with heartburn (but negative SI at 40%) c/w esophageal hypersensitivity  Endoscopic history: - EGD (2017): Mild gastritis, small HH - EGD (11/2018, Dr. Bryan Lemma): Normal esophagus, Hill grade 2 valve with <1 cm sliding HH, mild gastritis  FHx n/f PGM with Barrett's Esophagus.   HPI:    Due to current restrictions/limitations of in-office visits due to the COVID-19 pandemic, this scheduled clinical appointment was converted to a telehealth virtual consultation using D[employer].  Patient unable to open proximity app due to connectivity issues, so converted to telephone call.  -Time of medical discussion: 26 minutes -The patient did consent to this virtual visit and is aware of possible charges through their insurance for this visit.  -Names of all parties present: Amy Carney (patient), Gerrit Heck, DO, [facility] (physician) -Patient location: Home -Physician location: Office  Patient is a 43 y.o. female presenting to the [facility]enterology [facility] for follow-up.  Initially seen by me in 11/2018, and since then completed EGD (essentially normal, Hill grade 2 valve), esophageal manometry (normal), and pH/impedance (minimal esophageal acid exposure, esophageal hypersensitivity).   She was prescribed Buspar by her PCM in 11/2018 for irritability/anxiety, but has not started awaiting this appt. Has previously used alprazolam prn for anxiety, but does not like the sedating effect.   Today she states sxs overall largely the same. Has days of increased sxs. No longer uses Carafate, but will use Reglan prn with breakthrough abdominal pressure sxs (rare use though). Dexilant daily    Review of systems:     No chest pain, no SOB, no fevers, no urinary sx   Past Medical History:  Diagnosis Date  . Anemia   . Anxiety   . Bilateral neck pain   . Chronic migraine without aura without status migrainosus, not intractable   . Fatigue   . Gastritis   . GERD (gastroesophageal reflux disease)   . Hiatal hernia   . Internal hemorrhoids   . Neuralgia   . Recurrent major depression in remission ([place])   . Sinusitis   . Skin tag of perianal region   . Weakness     Patient's surgical history, family medical history, social history, medications and allergies were all reviewed in Epic    Current Outpatient Medications  Medication Sig Dispense Refill  . aspirin-acetaminophen-caffeine (EXCEDRIN MIGRAINE) 250-250-65 MG tablet Take by mouth every 8 (eight) hours as needed for headache.    . Cholecalciferol (VITAMIN D3) 25 MCG TABS Take 3 tablets by mouth daily.     . Cyanocobalamin (VITAMIN B-12) 1000 MCG/15ML LIQD Inject 1,000 mcg into the skin every 14 (fourteen) days. Does it 2 times a month    . DEXILANT 60 MG capsule TAKE 1 CAPSULE BY MOUTH EVERY DAY 90 capsule 1  . ibuprofen (ADVIL,MOTRIN) 200 MG tablet Take 200 mg by mouth every 8 (eight) hours as needed.    . metoCLOPramide (REGLAN) 10 MG  tablet Take 1 tablet (10 mg total) by mouth 2 (two) times daily as needed. 30 tablet 1  . sucralfate (CARAFATE) 1 g tablet Take 1 tablet (1 g total) by mouth 4 (four) times daily -  before meals and at bedtime. Make into slurry (Patient taking differently: Take 1 g by mouth as needed. Make  into slurry---use as needed) 120 tablet 1  . busPIRone (BUSPAR) 5 MG tablet Take 1 tablet by mouth daily.    Marland Kitchen EVENING PRIMROSE OIL PO Take by mouth daily.     No current facility-administered medications for this visit.     Physical Exam:     Ht 6' (1.829 m)   Wt 180 lb (81.6 kg)   BMI 24.41 kg/m   Complete physical exam not completed due to the nature of this telehealth communication.   Gen: Awake, alert, and oriented, and well communicative. Psych: Pleasant, cooperative, normal speech, thought processing seemingly intact   IMPRESSION and PLAN:    1) Esophageal Hypersensitivity -Start Effexor 25 mg qhs.  Depending on response to therapy, can increase to 37.5 mg and eventually to 75 mg/day to achieve response - Continue Dexilant for now, and if appropriate spots to neuromodulation, plan to titrate down starting in 2 weeks.  Outlined titration plan today to limit rebound hypersecretion - Can stop Reglan -Is not taking BuSpar -No longer taking Pepcid - No SI, HI.  Reviewed medication ADR profile at length today -No plan for antireflux surgical intervention  2) Small Hiatal Hernia - Small, <1 cm, sliding type HH on recent EGD. No plan for referral for hernia surgery given small size, lack of Erosive Esophagitis and o/w minimal esophageal acid exposure on pH/Mii study  3) Upper abdominal pressure/nonulcer dyspepsia: - Evaluate for improvement with neuromodulation therapy as above - Can trial course of FDGuard  RTC in 3 months or sooner as needed  I spent over 25 minutes of time with the patient. Greater than 50% of the time was spent counseling and coordinating care.          Shellia Cleverly ,DO, FACG 02/28/2019, 8:23 AM

## 2019-10-02 ENCOUNTER — Other Ambulatory Visit: Payer: Self-pay | Admitting: Specialist

## 2019-10-02 DIAGNOSIS — Z1231 Encounter for screening mammogram for malignant neoplasm of breast: Secondary | ICD-10-CM

## 2019-11-03 ENCOUNTER — Ambulatory Visit
Admission: RE | Admit: 2019-11-03 | Discharge: 2019-11-03 | Disposition: A | Discharge status: Home / Self Care | Payer: 59 | Source: Ambulatory Visit | Attending: Specialist | Admitting: Specialist

## 2019-11-03 ENCOUNTER — Other Ambulatory Visit: Payer: Self-pay

## 2019-11-03 DIAGNOSIS — Z1231 Encounter for screening mammogram for malignant neoplasm of breast: Secondary | ICD-10-CM

## 2019-11-20 ENCOUNTER — Other Ambulatory Visit: Payer: Self-pay | Admitting: Gastroenterology

## 2019-11-20 MED ORDER — VENLAFAXINE HCL 50 MG PO TABS: 50.0000 mg | ORAL_TABLET | Freq: Every day | ORAL | 3 refills | Status: DC

## 2020-01-16 ENCOUNTER — Ambulatory Visit: Payer: 59 | Admitting: Gastroenterology

## 2020-01-16 ENCOUNTER — Encounter: Payer: Self-pay | Admitting: Gastroenterology

## 2020-01-16 VITALS — BP 98/68 | HR 69 | Ht 72.0 in | Wt 171.0 lb

## 2020-01-16 DIAGNOSIS — R12 Heartburn: Secondary | ICD-10-CM

## 2020-01-16 NOTE — Progress Notes (Signed)
P  Chief Complaint:    Heartburn, hiatal hernia, esophageal hypersensitivity  GI History: 44 year old female with a longstanding history of reflux symptoms, characterized by heartburn, globus sensation, increased throat clearing, dyspepsia, and increased epigastric pressure, referred to me in 11/2018 for evaluation for TIF.  At that time, endorsed increased frequency and breakthrough symptoms despite ongoing Dexilant, added Pepcid with minimal improvement.  Was further evaluated with EGD and EM/pH/impedance, as outlined below, and diagnosed with Esophageal Hypersensitivity.  GERD evaluation: -Last EGD: 11/2018 -Barium esophagram:None -Esophageal Manometry: 12/2018: Normal -pH/Impedance: 12/2018: Normal, JD 2.3, +SAP with heartburn (but negative SI at 40%) c/w esophageal hypersensitivity  Endoscopic history: - EGD (2017): Mild gastritis, small HH - EGD (11/2018, Dr. Barron Alvine): Normal esophagus, Hill grade 2 valve with <1 cm sliding HH, mild gastritis  FHx n/f PGM with Barrett's Esophagus.  HPI:     Patient is a 44 y.o. female presenting to the [facility]ology Clinic for follow-up.  Last seen by me on 02/28/2019.  Started Effexor 25 mg qhs with plan to uptitrate to 75 mg/day as needed, along with consideration of de-escalating Dexilant, stopping Reglan, stopped Pepcid.  Also discussed trial course of FD guard for nonulcer dyspepsia.  Stopped Effexor after just 3 nights due to flushing.  Was seen by her PCM on 07/24/2019.  Recommended retrialing the Effexor (which she has done).  Continued alprazolam as needed for anxiety.  Today, she states her heartburn is controlled with Dexilant and since restarting Effexor, which was increased to 50 mg 1.5 months ago, with good improvement. Has tried to wean off the Dexilant, with breakthrough 3 days or so later (but that was while she was at Effexor 25 mg/day).  She is otherwise without any complaints today and feels in her usual state of  health.   Review of systems:     No chest pain, no SOB, no fevers, no urinary sx   Past Medical History:  Diagnosis Date  . Anemia   . Anxiety   . Bilateral neck pain   . Chronic migraine without aura without status migrainosus, not intractable   . Fatigue   . Gastritis   . GERD (gastroesophageal reflux disease)   . Hiatal hernia   . Internal hemorrhoids   . Neuralgia   . Recurrent major depression in remission ([facility])   . Sinusitis   . Skin tag of perianal region   . Weakness     Patient's surgical history, family medical history, social history, medications and allergies were all reviewed in Epic    Current Outpatient Medications  Medication Sig Dispense Refill  . aspirin-acetaminophen-caffeine (EXCEDRIN MIGRAINE) 250-250-65 MG tablet Take by mouth every 8 (eight) hours as needed for headache.    . busPIRone (BUSPAR) 5 MG tablet Take 1 tablet by mouth daily.    . Cholecalciferol (VITAMIN D3) 25 MCG TABS Take 3 tablets by mouth daily.     . Cyanocobalamin (VITAMIN B-12) 1000 MCG/15ML LIQD Inject 1,000 mcg into the skin every 14 (fourteen) days. Does it 2 times a month    . DEXILANT 60 MG capsule TAKE 1 CAPSULE BY MOUTH EVERY DAY 90 capsule 1  . EVENING PRIMROSE OIL PO Take by mouth daily.    Marland Kitchen ibuprofen (ADVIL,MOTRIN) 200 MG tablet Take 200 mg by mouth every 8 (eight) hours as needed.    . metoCLOPramide (REGLAN) 10 MG tablet Take 1 tablet (10 mg total) by mouth 2 (two) times daily as needed. 30 tablet 1  . sucralfate (CARAFATE)  1 g tablet Take 1 tablet (1 g total) by mouth 4 (four) times daily -  before meals and at bedtime. Make into slurry (Patient taking differently: Take 1 g by mouth as needed. Make into slurry---use as needed) 120 tablet 1  . venlafaxine (EFFEXOR) 25 MG tablet Take 1 tablet (25 mg total) by mouth at bedtime. 60 tablet 2  . venlafaxine (EFFEXOR) 50 MG tablet Take 1 tablet (50 mg total) by mouth daily. 90 tablet 3   No current facility-administered  medications for this visit.    Physical Exam:     There were no vitals taken for this visit.  GENERAL:  Pleasant female in NAD PSYCH: : Cooperative, normal affect NEURO: Alert and oriented x 3, no focal neurologic deficits   IMPRESSION and PLAN:    1) Esophageal hypersensitivity 2) Functional Heartburn  -Will again try to titrate off the Dexilant.  We will go to qod x10 days, then q3d x10 days, then stop completely -Resume Effexor, and can uptitrate to 75 mg/day if needed  3) Anxiety -Taking BuSpar -Continue follow-up with PCM  RTC prn           Shellia Cleverly ,DO, FACG 01/16/2020, 1:46 PM

## 2020-01-16 NOTE — Patient Instructions (Signed)
Titrate off Dexilant as discussed.  Please follow up as needed

## 2020-06-14 ENCOUNTER — Encounter: Payer: Self-pay | Admitting: Allergy and Immunology

## 2020-06-14 ENCOUNTER — Other Ambulatory Visit: Payer: Self-pay

## 2020-06-14 ENCOUNTER — Ambulatory Visit: Payer: 59 | Admitting: Allergy and Immunology

## 2020-06-14 VITALS — BP 110/64 | HR 84 | Resp 16 | Ht 71.0 in | Wt 181.0 lb

## 2020-06-14 DIAGNOSIS — G43909 Migraine, unspecified, not intractable, without status migrainosus: Secondary | ICD-10-CM | POA: Diagnosis not present

## 2020-06-14 DIAGNOSIS — K219 Gastro-esophageal reflux disease without esophagitis: Secondary | ICD-10-CM

## 2020-06-14 DIAGNOSIS — G500 Trigeminal neuralgia: Secondary | ICD-10-CM | POA: Diagnosis not present

## 2020-06-14 DIAGNOSIS — J3089 Other allergic rhinitis: Secondary | ICD-10-CM

## 2020-06-14 DIAGNOSIS — G4720 Circadian rhythm sleep disorder, unspecified type: Secondary | ICD-10-CM

## 2020-06-14 MED ORDER — CYPROHEPTADINE HCL 4 MG PO TABS: ORAL_TABLET | ORAL | 5 refills | Status: DC

## 2020-06-14 MED ORDER — FAMOTIDINE 40 MG PO TABS: 40.0000 mg | ORAL_TABLET | Freq: Every day | ORAL | 5 refills | Status: DC

## 2020-06-14 NOTE — Progress Notes (Signed)
[place] - [place] - [place]   Dear Richardson Dopp,  Thank you for referring Fransisco Hertz to the [facility] [place] on 06/14/2020.   Below is a summation of this patient's evaluation and recommendations.  Thank you for your referral. I will keep you informed about this patient's response to treatment.   If you have any questions please do not hesitate to contact me.   Sincerely,  Jessica Priest, MD Allergy / Immunology [facility]   ______________________________________________________________________    NEW PATIENT NOTE  Referring Provider: Rhea Bleacher* Primary Provider: Krystal Clark, NP Date of office visit: 06/14/2020    Subjective:   Chief Complaint:  Amy Carney (DOB: 08-06-1975) is a 45 y.o. female who presents to the clinic on 06/14/2020 with a chief complaint of Recurrent Sinusitis .     HPI: Breasia presents to this clinic in evaluation of "sinus".  She has a long history of sinus problems that sound as though they are mostly a pain syndrome affecting the right side of her face and some issues with stuffiness and drainage.  The symptoms have been quite significant for her in the past and in fact she underwent a sinus surgery in 2015 where it sounds as though she had a "spur" removed and they cleared out "some stuff" at [facility].  She only had about 6 months of relief from her issues with that treatment.  She uses azelastine and Flonase rarely averaging out to just a few times a month and she will use Allegra-D rarely.  She describes these headaches that occur about 4 times per week affecting her right eye that migrate down into her neck that are pounding and occasionally gets severe associated with nausea and spots and intermittent dizziness for which she will take an Excedrin about 4 times per week.  She has been stuck in  this pattern for at least 10 years.  It should be noted that she has 2 caffeinated drinks per day and also has chocolate about twice a week.  Provoking factors for these headaches include exposure to smoke.  She has apparently had a CT scan of her sinuses within the past month which did not identify any significant amount of sinusitis, report unavailable for review.  Her drainage is associated with throat clearing and intermittent raspy voice.  She does have reflux disease that is under pretty good control at this point time with Dexilant though she may occasionally use Reglan for episodes of regurgitation.  She used Reglan much more so in the past.  She has consolidated her caffeine consumption which has helped this issue.  As noted above she still continues to drink 2 sodas or teas per day and has chocolate twice a week and uses Excedrin with caffeine 4 times per week.  She also has very significant sleep dysfunction and maintaining sleep is difficult.  She does not wake up with gasping or choking or not breathing right.  She has contracted Covid infection last month with mostly an upper airway issue.  Past Medical History:  Diagnosis Date  . Anemia   . Anxiety   . Bilateral neck pain   . Chronic migraine without aura without status migrainosus, not intractable   . Fatigue   . Gastritis   . GERD (gastroesophageal reflux disease)   . Hiatal hernia   . Internal hemorrhoids   . Neuralgia   .  Recurrent major depression in remission (HCC)   . Sinusitis   . Skin tag of perianal region   . Weakness     Past Surgical History:  Procedure Laterality Date  . ANAL SPHINCTEROTOMY    . BREAST BIOPSY Right 03/07/2016  . CESAREAN SECTION    . DILATION AND CURETTAGE OF UTERUS    . ENDOMETRIAL ABLATION    . ESOPHAGEAL MANOMETRY N/A 01/06/2019   Procedure: ESOPHAGEAL MANOMETRY (EM);  Surgeon: Napoleon Form, MD;  Location: [facility];  Service: Endoscopy;  Laterality: N/A;  . LAPAROSCOPY     . NASAL SINUS SURGERY    . PH IMPEDANCE STUDY N/A 01/06/2019   Procedure: PH IMPEDANCE STUDY;  Surgeon: Napoleon Form, MD;  Location: [facility];  Service: Endoscopy;  Laterality: N/A;  . TONSILLECTOMY    . TUBAL LIGATION      Allergies as of 06/14/2020      Reactions   Levaquin  [levofloxacin In D5w]    Topamax [topiramate] Other (See Comments)   Numbness    Penicillins Hives, Rash   Sulfa Antibiotics Hives, Rash      Medication List    busPIRone 5 MG tablet Commonly known as: BUSPAR Take by mouth.   Dexilant 60 MG capsule Generic drug: dexlansoprazole TAKE 1 CAPSULE BY MOUTH EVERY DAY   metoCLOPramide 10 MG tablet Commonly known as: REGLAN Take 1 tablet (10 mg total) by mouth 2 (two) times daily as needed.       Review of systems negative except as noted in HPI / PMHx or noted below:  Review of Systems  Constitutional: Negative.   HENT: Negative.   Eyes: Negative.   Respiratory: Negative.   Cardiovascular: Negative.   Gastrointestinal: Negative.   Genitourinary: Negative.   Musculoskeletal: Negative.   Skin: Negative.   Neurological: Negative.   Endo/Heme/Allergies: Negative.   Psychiatric/Behavioral: Negative.     Family History  Problem Relation Age of Onset  . Colon cancer Other        Great Maternal Grandmother  . Barrett's esophagus Paternal Grandmother   . Osteoporosis Paternal Grandmother   . Migraines Mother   . Skin cancer Mother   . Hypertension Maternal Grandmother   . Lymphoma Maternal Grandfather   . Stroke Paternal Grandfather   . Stomach cancer Neg Hx   . Pancreatic cancer Neg Hx     Social History   Socioeconomic History  . Marital status: Married    Spouse name: Not on file  . Number of children: 2  . Years of education: 28  . Highest education level: Not on file  Occupational History  . Occupation: Home care  Tobacco Use  . Smoking status: Former Smoker    Years: 5.00    Types: Cigarettes    Quit date: 2003     Years since quitting: 19.0  . Smokeless tobacco: Never Used  Vaping Use  . Vaping Use: Never used  Substance and Sexual Activity  . Alcohol use: Yes    Alcohol/week: 0.0 standard drinks    Comment: Social  . Drug use: No  . Sexual activity: Yes    Partners: Male    Comment: Married  Other Topics Concern  . Not on file  Social History Narrative   Lives at home w/ her husband and children   Right-handed   Caffeine: 2-3 cups per day   Environmental and Social history  Lives in a house with a dry environment, dog look inside the household, carpet in  the bedroom, rabbits located outside the household, no plastic on the bed, no plastic on the pillow, no smoking ongoing with inside the household.  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  Objective:   Vitals:   06/14/20 1417  BP: 110/64  Pulse: 84  Resp: 16  SpO2: 97%   Height: 5\' 11"  (180.3 cm) Weight: 181 lb (82.1 kg)  Physical Exam Constitutional:      Appearance: She is not diaphoretic.  HENT:     Head: Normocephalic.     Right Ear: Tympanic membrane, ear canal and external ear normal.     Left Ear: Tympanic membrane, ear canal and external ear normal.     Nose: Nose normal. No mucosal edema or rhinorrhea.     Mouth/Throat:     Mouth: Oropharynx is clear and moist and mucous membranes are normal.     Pharynx: Uvula midline. No oropharyngeal exudate.  Eyes:     Conjunctiva/sclera: Conjunctivae normal.  Neck:     Thyroid: No thyromegaly.     Trachea: Trachea normal. No tracheal tenderness or tracheal deviation.  Cardiovascular:     Rate and Rhythm: Normal rate and regular rhythm.     Heart sounds: Normal heart sounds, S1 normal and S2 normal. No murmur heard.   Pulmonary:     Effort: No respiratory distress.     Breath sounds: Normal breath sounds. No stridor. No wheezing or rales.  Musculoskeletal:        General: No edema.  Lymphadenopathy:     Head:     Right side of head:  No tonsillar adenopathy.     Left side of head: No tonsillar adenopathy.     Cervical: No cervical adenopathy.  Skin:    Findings: No erythema or rash.     Nails: There is no clubbing.  Neurological:     Mental Status: She is alert.     Diagnostics: Allergy skin tests were performed. She did not demonstrate any hypersensitivity to a screening panel of aeroallergens or foods.  Results of blood tests obtained 28 April 2020 identified WBC 5.1, absolute eosinophil 100, absolute lymphocyte 1700, hemoglobin 12.0, platelet 209  Assessment and Plan:    1. Perennial allergic rhinitis   2. Migraine syndrome   3. Facial pain syndrome   4. LPRD (laryngopharyngeal reflux disease)   5. Dysfunction of sleep stage or arousal      1.  Allergen avoidance measures?  2.  Treat and prevent inflammation:   A.  OTC Rhinocort/Nasacort -1 spray each nostril 1 time per day  3.  Treat and prevent reflux/LPR:   A.  Continue Dexilant 60 mg in a.m.  B.  Start famotidine 40 mg in p.m.  C.  Slowly consolidate all forms of caffeine consumption  4.  Treat and prevent headache/facial pain syndrome:   A.  Periactin 4 mg - 1/2 - 1 tablet at bedtime  B.  Slowly consolidate all forms of caffeine consumption  5.  Treat and prevent sleep dysfunction:   A.  Periactin 4 mg - 1/2 - 1 tablet at bedtime  B.  Slowly consolidate all forms of caffeine consumption  6.  If needed:   A. Nasal saline  B. OTC antihistamine  7.  Return to clinic in 4 weeks or earlier if problem   It sounds as though Kacy has inflammation of her airway and I suspect that the major trigger giving rise to this issue  is LPR and in addition to using some anti-inflammatory agents for airway we will have her address her reflux with the plan noted above which includes consolidating all of her caffeine consumption. I think that her caffeine consumption is also driving some of her headache/facial pain syndrome and her sleep dysfunction.  We'll start her on Periactin in addition to having her consolidate caffeine consumption to address both of these issues. I will see her back in this clinic in 4 weeks to make a determination about further evaluation and treatment based upon her response.  Jessica PriestEric J. Quamere Mussell, MD Allergy / Immunology  [facility]

## 2020-06-14 NOTE — Patient Instructions (Addendum)
  1.  Allergen avoidance measures?  2.  Treat and prevent inflammation:   A.  OTC Rhinocort/Nasacort -1 spray each nostril 1 time per day  3.  Treat and prevent reflux/LPR:   A.  Continue Dexilant 60 mg in a.m.  B.  Start famotidine 40 mg in p.m.  C.  Slowly consolidate all forms of caffeine consumption  4.  Treat and prevent headache/facial pain syndrome:   A.  Periactin 4 mg - 1/2 - 1 tablet at bedtime  B.  Slowly consolidate all forms of caffeine consumption  5.  Treat and prevent sleep dysfunction:   A.  Periactin 4 mg - 1/2 - 1 tablet at bedtime  B.  Slowly consolidate all forms of caffeine consumption  6.  If needed:   A. Nasal saline  B. OTC antihistamine  7.  Return to clinic in 4 weeks or earlier if problem

## 2020-06-15 ENCOUNTER — Encounter: Payer: Self-pay | Admitting: Allergy and Immunology

## 2020-07-19 ENCOUNTER — Ambulatory Visit: Payer: 59 | Admitting: Internal Medicine

## 2020-07-19 ENCOUNTER — Encounter: Payer: Self-pay | Admitting: Internal Medicine

## 2020-07-19 VITALS — BP 100/58 | HR 94 | Ht 72.0 in | Wt 183.0 lb

## 2020-07-19 DIAGNOSIS — R0989 Other specified symptoms and signs involving the circulatory and respiratory systems: Secondary | ICD-10-CM

## 2020-07-19 DIAGNOSIS — K219 Gastro-esophageal reflux disease without esophagitis: Secondary | ICD-10-CM | POA: Diagnosis not present

## 2020-07-19 DIAGNOSIS — R151 Fecal smearing: Secondary | ICD-10-CM | POA: Diagnosis not present

## 2020-07-19 DIAGNOSIS — R198 Other specified symptoms and signs involving the digestive system and abdomen: Secondary | ICD-10-CM | POA: Diagnosis not present

## 2020-07-19 DIAGNOSIS — R12 Heartburn: Secondary | ICD-10-CM | POA: Diagnosis not present

## 2020-07-19 DIAGNOSIS — R1013 Epigastric pain: Secondary | ICD-10-CM

## 2020-07-19 MED ORDER — NORTRIPTYLINE HCL 25 MG PO CAPS: 25.0000 mg | ORAL_CAPSULE | Freq: Every day | ORAL | 1 refills | Status: DC

## 2020-07-19 NOTE — Patient Instructions (Signed)
STOP Pepcid( famotidine).   Continue Dexilant 60mg - once daily.   A high fiber diet with plenty of fluids (up to 8 glasses of water daily) is suggested to relieve these symptoms.  Metamucil, 1 tablespoon once daily can be used to keep bowels regular.   We have sent the following medications to your pharmacy for you to pick up at your convenience: Nortriptyline - Take 1 pill by mouth at bedtime.   If you are age 45 or younger, your body mass index should be between 19-25. Your Body mass index is 24.82 kg/m. If this is out of the aformentioned range listed, please consider follow up with your Primary Care Provider.    Thank you for choosing me and [facility].  Dr. 06-24-1969 Pyrtle

## 2020-07-21 ENCOUNTER — Encounter: Payer: Self-pay | Admitting: Internal Medicine

## 2020-07-21 NOTE — Progress Notes (Signed)
Subjective:    Patient ID: Amy Carney, female    DOB: July 19, 1975, 45 y.o.   MRN: [id number]  HPI Amy Carney is a 45 year old female with a history of GERD (heartburn, globus sensation, throat clearing), dyspepsia with increased epigastric pressure who is here for follow-up.  She is here alone today and was last seen by Dr. Barron Alvine in August 2021.  GERD evaluation: -LastEGD:11/2018 -Barium esophagram:None -Esophageal Manometry:12/2018: Normal -pH/Impedance:12/2018: Normal, JD 2.3, +SAPwith heartburn (but negativeSI at 40%)c/wesophageal hypersensitivity  Endoscopic history: -EGD (2017): Mild gastritis, small HH -EGD (11/2018, Dr. Barron Alvine): Normal esophagus, Hill grade 2valve with<1 cmsliding HH, mild gastritis  She reports that she has determined that she definitively needs Dexilant which she takes 60 mg daily.  She has been restarted on famotidine 40 mg in the evening near bedtime by her allergy and immunology doctor for sinus issues.  They were wondering if the sinus issues were GERD related.  She has not been able to tell any difference since adding back the famotidine.  She was also placed on Periactin and she does feel like this is helped somewhat.  She has continued to have issues with throat clearing and globus sensation.  There are some burning substernal chest pain/heartburn symptom intermittently as well.  Dexilant definitively helps.  She will have intermittent epigastric pressure, pushing up into her lower chest.  She reports feeling miserable if she is off Dexilant.  She did try Effexor 25 and 50 mg but could not tell that this really benefited her functional heartburn/esophageal hypersensitivity.  She has been using BuSpar 5 mg at bedtime though it is written for 5 mg twice daily.  She was worried that it might make her sleepy.   Review of Systems As per HPI, otherwise negative  Current Medications, Allergies, Past Medical History, Past Surgical  History, Family History and Social History were reviewed in [employer] record.     Objective:   Physical Exam BP (!) 100/58   Pulse 94   Ht 6' (1.829 m)   Wt 183 lb (83 kg)   SpO2 97%   BMI 24.82 kg/m  Gen: awake, alert, NAD HEENT: anicteric Neuro: nonfocal      Assessment & Plan:  45 year old female with a history of GERD (heartburn, globus sensation, throat clearing), dyspepsia with increased epigastric pressure who is here for follow-up.   1.  GERD with esophageal hypersensitivity/functional heartburn --she definitively benefits from PPI but has not seen an improvement with adding famotidine at bedtime.  I believe we can stop the famotidine now.  We discussed her symptoms and I think we should try nortriptyline nightly for esophageal hypersensitivity and functional heartburn. --Continue Dexilant 60 mg daily --Begin nortriptyline 25 mg nightly, we may need to dose titrate --Stop famotidine  2.  Intermittent epigastric pressure --also functional in nature.  We are treating with PPI as above.  Nortriptyline may help this symptom as well.  She prefers to keep the metoclopramide 10 mg on hand.  She uses this very very sparingly and thus she can use 10 mg twice daily as needed.  3.  Fecal smearing --mild without bleeding or other alarm symptoms. --Add Metamucil 1 heaping tablespoon daily  4.  Anxiety --I encouraged her to increase the BuSpar to twice daily, as it is currently written.  This is very unlikely to make her sleepy.  She will give this a try.  30 minutes total spent today including patient facing time, coordination of care, reviewing medical  history/procedures/pertinent radiology studies, and documentation of the encounter.

## 2020-10-13 ENCOUNTER — Other Ambulatory Visit: Payer: Self-pay | Admitting: Internal Medicine

## 2020-10-19 ENCOUNTER — Other Ambulatory Visit: Payer: Self-pay | Admitting: Specialist

## 2020-10-19 DIAGNOSIS — Z1231 Encounter for screening mammogram for malignant neoplasm of breast: Secondary | ICD-10-CM

## 2020-11-15 DIAGNOSIS — Z8249 Family history of ischemic heart disease and other diseases of the circulatory system: Secondary | ICD-10-CM | POA: Diagnosis not present

## 2020-12-14 ENCOUNTER — Other Ambulatory Visit: Payer: Self-pay

## 2020-12-14 ENCOUNTER — Ambulatory Visit
Admission: RE | Admit: 2020-12-14 | Discharge: 2020-12-14 | Disposition: A | Discharge status: Home / Self Care | Payer: 59 | Source: Ambulatory Visit | Attending: Specialist | Admitting: Specialist

## 2020-12-14 DIAGNOSIS — Z1231 Encounter for screening mammogram for malignant neoplasm of breast: Secondary | ICD-10-CM

## 2020-12-16 ENCOUNTER — Other Ambulatory Visit: Payer: Self-pay | Admitting: Specialist

## 2020-12-16 DIAGNOSIS — R928 Other abnormal and inconclusive findings on diagnostic imaging of breast: Secondary | ICD-10-CM

## 2020-12-22 ENCOUNTER — Other Ambulatory Visit: Payer: Self-pay | Admitting: Internal Medicine

## 2021-01-05 ENCOUNTER — Ambulatory Visit
Admission: RE | Admit: 2021-01-05 | Discharge: 2021-01-05 | Disposition: A | Discharge status: Home / Self Care | Payer: 59 | Source: Ambulatory Visit | Attending: Specialist | Admitting: Specialist

## 2021-01-05 ENCOUNTER — Ambulatory Visit: Payer: 59

## 2021-01-05 ENCOUNTER — Other Ambulatory Visit: Payer: Self-pay

## 2021-01-05 DIAGNOSIS — R928 Other abnormal and inconclusive findings on diagnostic imaging of breast: Secondary | ICD-10-CM

## 2021-03-01 ENCOUNTER — Other Ambulatory Visit: Payer: Self-pay | Admitting: Internal Medicine

## 2021-05-25 ENCOUNTER — Other Ambulatory Visit: Payer: Self-pay | Admitting: Specialist

## 2021-05-25 DIAGNOSIS — M5030 Other cervical disc degeneration, unspecified cervical region: Secondary | ICD-10-CM

## 2021-06-19 ENCOUNTER — Other Ambulatory Visit: Payer: 59

## 2021-06-26 ENCOUNTER — Ambulatory Visit
Admission: RE | Admit: 2021-06-26 | Discharge: 2021-06-26 | Disposition: A | Discharge status: Home / Self Care | Payer: 59 | Source: Ambulatory Visit | Attending: Specialist | Admitting: Specialist

## 2021-06-26 ENCOUNTER — Other Ambulatory Visit: Payer: Self-pay

## 2021-06-26 DIAGNOSIS — M5030 Other cervical disc degeneration, unspecified cervical region: Secondary | ICD-10-CM

## 2021-06-26 MED ORDER — GADOBENATE DIMEGLUMINE 529 MG/ML IV SOLN
15.0000 mL | Freq: Once | INTRAVENOUS | Status: AC | PRN
  Administered 2021-06-26: 15 mL via INTRAVENOUS

## 2021-09-20 ENCOUNTER — Other Ambulatory Visit: Payer: Self-pay | Admitting: Specialist

## 2021-09-20 DIAGNOSIS — R1084 Generalized abdominal pain: Secondary | ICD-10-CM

## 2021-09-22 ENCOUNTER — Telehealth: Payer: Self-pay | Admitting: Internal Medicine

## 2021-09-22 NOTE — Telephone Encounter (Signed)
Patient called requesting to speak with nurse regarding changes in GI symptoms. Per patient, having increased abd pain that radiates to chest. Patient also states having some blood in stool. Please advise. ?

## 2021-09-22 NOTE — Telephone Encounter (Signed)
Left message for pt to call back  °

## 2021-09-23 NOTE — Telephone Encounter (Signed)
Pt states Dr. Norman Herrlich put her on elavil last Feb. But her PCP stopped it due to her having dreams and nightmares. Since she stopped it the abdominal pain/symptoms have returned. PCP has started her on Robaxin to see if that helps. Pt reports she has seen blood in her stool a couple of times. States she is not going to have insurance much longer and wants to see Dr. Hilarie Fredrickson before she has no insurance. Pt scheduled to see Dr. Norman Herrlich 5/9@4pm . Pt aware of appt. ?

## 2021-09-27 ENCOUNTER — Encounter: Payer: Self-pay | Admitting: Internal Medicine

## 2021-09-27 ENCOUNTER — Ambulatory Visit: Payer: 59 | Admitting: Internal Medicine

## 2021-09-27 VITALS — BP 110/60 | HR 96 | Ht 73.0 in | Wt 180.0 lb

## 2021-09-27 DIAGNOSIS — K625 Hemorrhage of anus and rectum: Secondary | ICD-10-CM

## 2021-09-27 DIAGNOSIS — R12 Heartburn: Secondary | ICD-10-CM

## 2021-09-27 DIAGNOSIS — R1013 Epigastric pain: Secondary | ICD-10-CM

## 2021-09-27 DIAGNOSIS — K30 Functional dyspepsia: Secondary | ICD-10-CM | POA: Diagnosis not present

## 2021-09-27 DIAGNOSIS — K219 Gastro-esophageal reflux disease without esophagitis: Secondary | ICD-10-CM | POA: Diagnosis not present

## 2021-09-27 MED ORDER — AMITRIPTYLINE HCL 25 MG PO TABS: 25.0000 mg | ORAL_TABLET | Freq: Every day | ORAL | 1 refills | Status: DC

## 2021-09-27 NOTE — Progress Notes (Signed)
? ?Subjective:  ? ? Patient ID: Amy Carney, female    DOB: Feb 10, 1976, 46 y.o.   MRN: [id number] ? ?HPI ?Amy Carney is a 46 year old female with a history of GERD (heartburn, globus sensation and throat clearing) with esophageal hypersensitivity, dyspepsia with epigastric pressure, history of small internal hemorrhoids who is here for follow-up.  She was last seen on 07/19/2020 and she is here alone today. ? ?GERD evaluation: ?-Last EGD:  11/2018 ?-Barium esophagram: None ?-Esophageal Manometry:  12/2018: Normal ?-pH/Impedance:  12/2018: Normal, JD 2.3, +SAP with heartburn (but negative SI at 40%) c/w esophageal hypersensitivity ?  ?Endoscopic history: ?- EGD (2017): Mild gastritis, small HH ?- EGD (11/2018, Dr. Barron Alvine): Normal esophagus, Hill grade 2 valve with <1 cm sliding HH, mild gastritis ?--Colonoscopy (2017): Perianal skin tag, small internal hemorrhoids.  Normal colonoscopy to the cecum with excellent prep. ? ?At last visit Amy Carney was having ongoing issues with functional heartburn and epigastric pressure.  We continue Dexilant and started her on nortriptyline 25 mg nightly.  We stop famotidine at that time. ? ?She reports that she took nortriptyline for better part of a year.  She was not sure if it helped her heartburn and epigastric pressure.  It was causing nightmares and bad dreams and so she weaned off of this over about a month.  Under direction of primary care.  She has been off now for about 2 months.  She reports that she has had an extreme exacerbation of her epigastric pressure pushing up into her epigastrium and chest as well as worsening of her heartburn symptom.  She had previously tried Xanax at night which helped sleep but also calm some of this pressure.  Primary care started her on methocarbamol which has not really helped the abdominal pressure but has helped some of the tension that she feels in her right neck and shoulder area.  She has continue Dexilant 60 mg daily.  She is using  buspirone 5 mg at night occasionally twice daily.  She is able to eat at times some foods worsen her abdominal pressure at other times the same food does not.  She does not feel smaller portions are helpful.  She has rare nausea if ever.  No vomiting.  No dysphagia symptom. ? ?She reports extreme stress having lost her job which will end later this month.  This will also cause her to lose her insurance.  She hopes that she will qualify for her husband's insurance but knows that this is a high deductible plan.  Also unfortunately her husband has been recently diagnosed with bladder cancer.  It has not yet been staged and he will be facing treatment. ? ?Separate from the above she feels that she may have seen red material in her stool.  No definite blood though she has concern of this.  She has not had her period since uterine ablation in 2012. ? ?Primary care has recently checked her iron counts and hemoglobin.  While these remain normal she is concerned that they may be lower than they were previously. ? ? ?Review of Systems ?As per HPI, otherwise negative ? ?Current Medications, Allergies, Past Medical History, Past Surgical History, Family History and Social History were reviewed in [employer] record. ?   ?Objective:  ? Physical Exam ?Ht 6\' 1"  (1.854 m)   Wt 180 lb (81.6 kg)   BMI 23.75 kg/m?  ?Gen: awake, alert, NAD ?HEENT: anicteric  ?Neuro: nonfocal ? ? ?Labs April 2023 ?CBC  normal ?CMP normal ?Ferritin 24 ?TIBC 367 (normal) ?% sat 16 (normal) ? ? ?   ?Assessment & Plan:  ?46 year old female with a history of GERD (heartburn, globus sensation and throat clearing) with esophageal hypersensitivity, dyspepsia with epigastric pressure, history of small internal hemorrhoids who is here for follow-up.  ? ?GERD with esophageal hypersensitivity/functional heartburn/epigastric pressure and functional dyspepsia --symptoms have dramatically exacerbated under definitive stressors but also  corresponding to when we stopped the nortriptyline.  I do think this is related.  I think this proves that TCA was helpful.  Dexilant also remains helpful.  She was having bad dreams with nortriptyline and perhaps amitriptyline will be better tolerated with the same positive GI benefit without side effect.  She is willing to try this.  She will remain on low-dose buspirone but not increase buspirone at this time.  Low-dose buspirone and low-dose TCA should have low risk of serotonin syndrome.  She was taking nortriptyline and buspirone without side effects. ?--Continue Dexilant 60 mg daily ?--Begin amitriptyline 25 mg x 7 days nightly and if tolerated increase to 50 mg nightly ?--Continue low-dose buspirone 5 mg nightly ?--If TCA is not tolerated due to nightmares then I will likely discontinue TCA altogether and push to more therapeutic doses of buspirone ?--Metoclopramide 5 to 10 mg has been helpful in the past but she really has not been using this. ?--Office follow-up with me in July ? ?2.  Question blood in stool --she does have a history of small internal hemorrhoids and had a normal colonoscopy to evaluate rectal bleeding in 2017.  This is reassuring.  It is also reassuring that her CBC and iron studies are normal.  Given concern of possible blood in stool: ?-- FOBT cards x3 ? ?She will be losing her insurance but Amy Carney may be available to her and she will likely enter her husband's plan. ? ?40 minutes total spent today including patient facing time, coordination of care, reviewing medical history/procedures/pertinent radiology studies, and documentation of the encounter. ? ? ?

## 2021-09-27 NOTE — Patient Instructions (Addendum)
We have given you hemoccult cards, follow the instructions on the envelope given to you today ? ?Follow the instructions on the Hemoccult cards and mail them back to [place] when you are finished or you may take them directly to the lab in the basement of the [place] building. We will call you with the results.  ? ? ?We have sent the following medications to your pharmacy for you to pick up at your convenience:   Amitriptyline 25 mg  x 1 week, you can increase to 50 mg if tolerated ? ?Continue Dexilant ? ?If you are age 46 or older, your body mass index should be between 23-30. Your Body mass index is 23.75 kg/m?Marland Kitchen If this is out of the aforementioned range listed, please consider follow up with your Primary Care Provider. ? ?If you are age 40 or younger, your body mass index should be between 19-25. Your Body mass index is 23.75 kg/m?Marland Kitchen If this is out of the aformentioned range listed, please consider follow up with your Primary Care Provider.  ? ?________________________________________________________ ? ?The [facility] providers would like to encourage you to use [facility] to communicate with providers for non-urgent requests or questions.  Due to long hold times on the telephone, sending your provider a message by [facility] may be a faster and more efficient way to get a response.  Please allow 48 business hours for a response.  Please remember that this is for non-urgent requests.  ?_______________________________________________________  ? ?I appreciate the  opportunity to care for you ? ?Thank You  ? ?Zenovia Jarred, MD  ? ? ? ?

## 2021-10-04 ENCOUNTER — Ambulatory Visit
Admission: RE | Admit: 2021-10-04 | Discharge: 2021-10-04 | Disposition: A | Discharge status: Home / Self Care | Payer: 59 | Source: Ambulatory Visit | Attending: Specialist | Admitting: Specialist

## 2021-10-04 DIAGNOSIS — R1084 Generalized abdominal pain: Secondary | ICD-10-CM

## 2021-10-04 MED ORDER — IOPAMIDOL (ISOVUE-370) INJECTION 76%
80.0000 mL | Freq: Once | INTRAVENOUS | Status: AC | PRN
  Administered 2021-10-04: 80 mL via INTRAVENOUS

## 2021-10-05 ENCOUNTER — Other Ambulatory Visit: Payer: Self-pay

## 2021-10-05 ENCOUNTER — Other Ambulatory Visit (INDEPENDENT_AMBULATORY_CARE_PROVIDER_SITE_OTHER): Payer: 59

## 2021-10-05 DIAGNOSIS — K625 Hemorrhage of anus and rectum: Secondary | ICD-10-CM

## 2021-10-05 LAB — HEMOCCULT SLIDES (X 3 CARDS)
Fecal Occult Blood: NEGATIVE
OCCULT 1: NEGATIVE
OCCULT 2: NEGATIVE
OCCULT 3: NEGATIVE
OCCULT 4: NEGATIVE
OCCULT 5: NEGATIVE

## 2021-11-10 ENCOUNTER — Other Ambulatory Visit: Payer: Self-pay | Admitting: *Deleted

## 2021-11-10 MED ORDER — AMITRIPTYLINE HCL 50 MG PO TABS: 50.0000 mg | ORAL_TABLET | Freq: Every day | ORAL | 1 refills | Status: DC

## 2021-11-28 ENCOUNTER — Other Ambulatory Visit: Payer: Self-pay | Admitting: Specialist

## 2021-11-28 DIAGNOSIS — Z1231 Encounter for screening mammogram for malignant neoplasm of breast: Secondary | ICD-10-CM

## 2022-01-06 ENCOUNTER — Ambulatory Visit
Admission: RE | Admit: 2022-01-06 | Discharge: 2022-01-06 | Disposition: A | Discharge status: Home / Self Care | Payer: Self-pay | Source: Ambulatory Visit | Attending: Specialist | Admitting: Specialist

## 2022-01-06 DIAGNOSIS — Z1231 Encounter for screening mammogram for malignant neoplasm of breast: Secondary | ICD-10-CM

## 2022-01-09 ENCOUNTER — Other Ambulatory Visit: Payer: Self-pay | Admitting: Specialist

## 2022-01-09 DIAGNOSIS — R928 Other abnormal and inconclusive findings on diagnostic imaging of breast: Secondary | ICD-10-CM

## 2022-01-11 ENCOUNTER — Telehealth: Payer: Self-pay

## 2022-01-11 MED ORDER — AMITRIPTYLINE HCL 50 MG PO TABS: 50.0000 mg | ORAL_TABLET | Freq: Every day | ORAL | 0 refills | Status: DC

## 2022-01-11 NOTE — Telephone Encounter (Signed)
Amitriptyline refilled as approved.

## 2022-01-11 NOTE — Telephone Encounter (Signed)
Pharmacy requesting refill on patient's amitriptyline 50mg  qhs. Please advise Sir, thank you.

## 2022-01-11 NOTE — Telephone Encounter (Signed)
Ok to refill 

## 2022-01-18 ENCOUNTER — Ambulatory Visit
Admission: RE | Admit: 2022-01-18 | Discharge: 2022-01-18 | Disposition: A | Discharge status: Home / Self Care | Payer: Commercial Managed Care - PPO | Source: Ambulatory Visit | Attending: Specialist | Admitting: Specialist

## 2022-01-18 ENCOUNTER — Ambulatory Visit
Admission: RE | Admit: 2022-01-18 | Discharge: 2022-01-18 | Disposition: A | Discharge status: Home / Self Care | Payer: Self-pay | Source: Ambulatory Visit | Attending: Specialist | Admitting: Specialist

## 2022-01-18 DIAGNOSIS — R928 Other abnormal and inconclusive findings on diagnostic imaging of breast: Secondary | ICD-10-CM

## 2022-01-27 ENCOUNTER — Encounter: Payer: Self-pay | Admitting: Internal Medicine

## 2022-02-06 ENCOUNTER — Encounter: Payer: Self-pay | Admitting: Internal Medicine

## 2022-02-06 ENCOUNTER — Other Ambulatory Visit: Payer: Self-pay

## 2022-02-06 MED ORDER — DEXLANSOPRAZOLE 60 MG PO CPDR: 60.0000 mg | DELAYED_RELEASE_CAPSULE | Freq: Every day | ORAL | 3 refills | Status: DC

## 2022-02-06 MED ORDER — DEXLANSOPRAZOLE 60 MG PO CPDR: 60.0000 mg | DELAYED_RELEASE_CAPSULE | Freq: Every day | ORAL | 1 refills | Status: DC

## 2022-02-10 ENCOUNTER — Encounter: Payer: Self-pay | Admitting: Internal Medicine

## 2022-02-10 MED ORDER — AMITRIPTYLINE HCL 50 MG PO TABS: 50.0000 mg | ORAL_TABLET | Freq: Every day | ORAL | 0 refills | Status: DC

## 2022-03-27 ENCOUNTER — Encounter: Payer: Self-pay | Admitting: Internal Medicine

## 2022-03-31 ENCOUNTER — Other Ambulatory Visit: Payer: Self-pay | Admitting: *Deleted

## 2022-03-31 MED ORDER — AMITRIPTYLINE HCL 50 MG PO TABS: 50.0000 mg | ORAL_TABLET | Freq: Every day | ORAL | 0 refills | Status: DC

## 2022-04-04 ENCOUNTER — Other Ambulatory Visit: Payer: Self-pay | Admitting: Orthopedic Surgery

## 2022-04-04 DIAGNOSIS — M25511 Pain in right shoulder: Secondary | ICD-10-CM

## 2022-04-25 ENCOUNTER — Ambulatory Visit
Admission: RE | Admit: 2022-04-25 | Discharge: 2022-04-25 | Disposition: A | Discharge status: Home / Self Care | Payer: Commercial Managed Care - PPO | Source: Ambulatory Visit | Attending: Orthopedic Surgery | Admitting: Orthopedic Surgery

## 2022-04-25 DIAGNOSIS — M25511 Pain in right shoulder: Secondary | ICD-10-CM

## 2022-05-11 ENCOUNTER — Other Ambulatory Visit: Payer: Self-pay | Admitting: Internal Medicine

## 2022-05-11 MED ORDER — AMITRIPTYLINE HCL 50 MG PO TABS: 50.0000 mg | ORAL_TABLET | Freq: Every day | ORAL | 0 refills | Status: DC

## 2022-06-02 ENCOUNTER — Encounter: Payer: Self-pay | Admitting: Internal Medicine

## 2022-06-02 ENCOUNTER — Ambulatory Visit (INDEPENDENT_AMBULATORY_CARE_PROVIDER_SITE_OTHER): Payer: Commercial Managed Care - PPO | Admitting: Internal Medicine

## 2022-06-02 VITALS — BP 120/82 | HR 95 | Ht 73.0 in | Wt 194.0 lb

## 2022-06-02 DIAGNOSIS — K219 Gastro-esophageal reflux disease without esophagitis: Secondary | ICD-10-CM | POA: Diagnosis not present

## 2022-06-02 DIAGNOSIS — K30 Functional dyspepsia: Secondary | ICD-10-CM

## 2022-06-02 DIAGNOSIS — R09A2 Foreign body sensation, throat: Secondary | ICD-10-CM | POA: Diagnosis not present

## 2022-06-02 DIAGNOSIS — R12 Heartburn: Secondary | ICD-10-CM | POA: Diagnosis not present

## 2022-06-02 DIAGNOSIS — R1013 Epigastric pain: Secondary | ICD-10-CM

## 2022-06-02 MED ORDER — DEXLANSOPRAZOLE 60 MG PO CPDR: 60.0000 mg | DELAYED_RELEASE_CAPSULE | Freq: Every day | ORAL | 1 refills | Status: DC

## 2022-06-02 MED ORDER — AMITRIPTYLINE HCL 50 MG PO TABS: 75.0000 mg | ORAL_TABLET | Freq: Every day | ORAL | 2 refills | Status: DC

## 2022-06-02 NOTE — Patient Instructions (Signed)
We have sent the following medications to your pharmacy for you to pick up at your convenience: Dexilant ([facility]) Amitriptyline 75 mg by mouth at bedtime. You may increase to 100 mg at bedtime if needed for functional heartburn (let us know if you increase to 100 so we can update your script.)  Restart your oral B12.  _______________________________________________________  If you are age 47 or older, your body mass index should be between 23-30. Your Body mass index is 25.6 kg/m. If this is out of the aforementioned range listed, please consider follow up with your Primary Care Provider.  If you are age 2 or younger, your body mass index should be between 19-25. Your Body mass index is 25.6 kg/m. If this is out of the aformentioned range listed, please consider follow up with your Primary Care Provider.   ________________________________________________________  The [place] [facility] providers would like to encourage you to use [facility] to communicate with providers for non-urgent requests or questions.  Due to long hold times on the telephone, sending your provider a message by [facility] may be a faster and more efficient way to get a response.  Please allow 48 business hours for a response.  Please remember that this is for non-urgent requests.  _______________________________________________________ Due to recent changes in healthcare laws, you may see the results of your imaging and laboratory studies on MyChart before your provider has had a chance to review them.  We understand that in some cases there may be results that are confusing or concerning to you. Not all laboratory results come back in the same time frame and the provider may be waiting for multiple results in order to interpret others.  Please give [place] 48 hours in order for your provider to thoroughly review all the results before contacting the office for clarification of your results.

## 2022-06-02 NOTE — Progress Notes (Signed)
   Subjective:    Patient ID: Amy Carney, female    DOB: February 28, 1976, 47 y.o.   MRN: [id number]  HPI Amy Carney is a 47 year old female with a history of GERD (heartburn, globus sensation and throat clearing) with esophageal hypersensitivity, dyspepsia with epigastric pressure, history of small internal hemorrhoids who is here for follow-up.  She was last seen on 09/27/2021.  She is here alone today.  GERD evaluation: -Last EGD:  11/2018 -Barium esophagram: None -Esophageal Manometry:  12/2018: Normal -pH/Impedance:  12/2018: Normal, JD 2.3, +SAP with heartburn (but negative SI at 40%) c/w esophageal hypersensitivity   Endoscopic history: - EGD (2017): Mild gastritis, small HH - EGD (11/2018, Dr. Bryan Lemma): Normal esophagus, Hill grade 2 valve with <1 cm sliding HH, mild gastritis --Colonoscopy (2017): Perianal skin tag, small internal hemorrhoids.  Normal colonoscopy to the cecum with excellent prep.  She was started on amitriptyline and worked up to 50 mg daily after her last visit.  This did not cause nightmares like the previous nortriptyline did.  She states that has been very helpful with her heartburn symptoms.  She has some breakthrough heartburn and certainly not on a daily basis.  Still with throat clearing and very mild globus sensation.  She is using Dexilant 60 mg daily but had an issue getting this approved with her new insurance company.  She rarely if ever uses metoclopramide.  She does take buspirone 10 mg in the morning.  Regular bowel movements without complaint.  She has not been as adherent with B12 of late.  Review of Systems As per HPI, otherwise negative  Current Medications, Allergies, Past Medical History, Past Surgical History, Family History and Social History were reviewed in [employer] record.    Objective:   Physical Exam BP 120/82   Pulse 95   Ht 6\' 1"  (1.854 m)   Wt 194 lb (88 kg)   BMI 25.60 kg/m  Gen: awake, alert,  NAD HEENT: anicteric Neuro: nonfocal     Assessment & Plan:  47 year old female with a history of GERD (heartburn, globus sensation and throat clearing) with esophageal hypersensitivity, dyspepsia with epigastric pressure, history of small internal hemorrhoids who is here for follow-up.  She was last seen on 09/27/2021.  GERD with esophageal hypersensitivity/functional heartburn/functional dyspepsia --she has benefited from amitriptyline but has some minor symptoms.  I am going to try to titrate amitriptyline to maximum 100 mg at bedtime to see if symptoms further resolved.  Dexilant has been definitively helpful. -- Refill Dexilant 60 mg daily; she has previously tried lansoprazole, pantoprazole, omeprazole, esomeprazole, famotidine and ranitidine without complete control.  Dexilant has been helpful for her. -- Increase amitriptyline to 75 mg nightly.  She should try this for a month and if symptoms persist she can try to uptitrate to 100 mg nightly for a month.  If no benefit return to the 50 mg dose. -- She will continue buspirone 10 mg every morning -- Annual follow-up  2.  B12 deficiency --resume monthly IM B12  3.  Colon cancer screening --average risk repeat colonoscopy recommended in July 2027 for screening.  30 minutes total spent today including patient facing time, coordination of care, reviewing medical history/procedures/pertinent radiology studies, and documentation of the encounter.

## 2022-11-16 ENCOUNTER — Encounter: Payer: Self-pay | Admitting: Internal Medicine

## 2022-11-16 MED ORDER — DEXLANSOPRAZOLE 60 MG PO CPDR: 60.0000 mg | DELAYED_RELEASE_CAPSULE | Freq: Every day | ORAL | 1 refills | Status: DC

## 2022-11-16 MED ORDER — AMITRIPTYLINE HCL 50 MG PO TABS: 100.0000 mg | ORAL_TABLET | Freq: Every day | ORAL | 4 refills | Status: DC

## 2022-11-27 ENCOUNTER — Other Ambulatory Visit: Payer: Self-pay | Admitting: Specialist

## 2022-11-27 DIAGNOSIS — Z1231 Encounter for screening mammogram for malignant neoplasm of breast: Secondary | ICD-10-CM

## 2023-01-08 ENCOUNTER — Ambulatory Visit: Payer: Commercial Managed Care - PPO

## 2023-01-08 ENCOUNTER — Ambulatory Visit: Admission: RE | Admit: 2023-01-08 | Payer: Commercial Managed Care - PPO | Source: Ambulatory Visit

## 2023-01-08 DIAGNOSIS — Z1231 Encounter for screening mammogram for malignant neoplasm of breast: Secondary | ICD-10-CM

## 2023-03-26 ENCOUNTER — Other Ambulatory Visit: Payer: Self-pay

## 2023-03-26 ENCOUNTER — Encounter: Payer: Self-pay | Admitting: Internal Medicine

## 2023-03-26 MED ORDER — DEXLANSOPRAZOLE 60 MG PO CPDR: 60.0000 mg | DELAYED_RELEASE_CAPSULE | Freq: Every day | ORAL | 0 refills | Status: DC

## 2023-03-26 MED ORDER — AMITRIPTYLINE HCL 50 MG PO TABS: 100.0000 mg | ORAL_TABLET | Freq: Every day | ORAL | 1 refills | Status: DC

## 2023-05-09 ENCOUNTER — Encounter: Payer: Self-pay | Admitting: Internal Medicine

## 2023-05-13 NOTE — Telephone Encounter (Signed)
Agree with plan patient has laid out She will completely wean off amitriptyline as previously recommended before starting duloxetine  Please prescribe duloxetine 30 mg daily; can give #90 with 1 refill She can continue taking the buspirone with this medicine May does titrate to 60 mg daily if needed in future

## 2023-05-17 ENCOUNTER — Other Ambulatory Visit: Payer: Self-pay

## 2023-05-17 MED ORDER — DULOXETINE HCL 30 MG PO CPEP: 30.0000 mg | ORAL_CAPSULE | Freq: Every day | ORAL | 1 refills | Status: DC

## 2023-05-31 ENCOUNTER — Other Ambulatory Visit: Payer: Self-pay

## 2023-05-31 ENCOUNTER — Encounter: Payer: Self-pay | Admitting: Internal Medicine

## 2023-05-31 MED ORDER — DEXLANSOPRAZOLE 60 MG PO CPDR: 60.0000 mg | DELAYED_RELEASE_CAPSULE | Freq: Every day | ORAL | 0 refills | Status: DC

## 2023-06-09 ENCOUNTER — Ambulatory Visit (HOSPITAL_BASED_OUTPATIENT_CLINIC_OR_DEPARTMENT_OTHER)
Admission: RE | Admit: 2023-06-09 | Discharge: 2023-06-09 | Disposition: A | Discharge status: Home / Self Care | Payer: Commercial Managed Care - PPO | Source: Ambulatory Visit | Attending: Family Medicine | Admitting: Family Medicine

## 2023-06-09 ENCOUNTER — Encounter (HOSPITAL_BASED_OUTPATIENT_CLINIC_OR_DEPARTMENT_OTHER): Payer: Self-pay

## 2023-06-09 VITALS — BP 122/74 | HR 83 | Temp 98.7°F | Resp 20 | Wt 190.0 lb

## 2023-06-09 DIAGNOSIS — J4521 Mild intermittent asthma with (acute) exacerbation: Secondary | ICD-10-CM | POA: Diagnosis not present

## 2023-06-09 DIAGNOSIS — R059 Cough, unspecified: Secondary | ICD-10-CM | POA: Insufficient documentation

## 2023-06-09 LAB — POC COVID19/FLU A&B COMBO
Covid Antigen, POC: NEGATIVE
Influenza A Antigen, POC: NEGATIVE
Influenza B Antigen, POC: NEGATIVE

## 2023-06-09 MED ORDER — DOXYCYCLINE HYCLATE 100 MG PO CAPS
100.0000 mg | ORAL_CAPSULE | Freq: Two times a day (BID) | ORAL | 0 refills | Status: AC
Start: 2023-06-09 — End: 2023-06-16

## 2023-06-09 MED ORDER — DEXAMETHASONE SODIUM PHOSPHATE 10 MG/ML IJ SOLN
10.0000 mg | Freq: Once | INTRAMUSCULAR | Status: AC
  Administered 2023-06-09: 10 mg via INTRAMUSCULAR

## 2023-06-09 MED ORDER — ALBUTEROL SULFATE HFA 108 (90 BASE) MCG/ACT IN AERS: 2.0000 | INHALATION_SPRAY | RESPIRATORY_TRACT | 0 refills | Status: AC | PRN

## 2023-06-09 MED ORDER — BENZONATATE 100 MG PO CAPS: 100.0000 mg | ORAL_CAPSULE | Freq: Three times a day (TID) | ORAL | 0 refills | Status: AC | PRN

## 2023-06-09 NOTE — Discharge Instructions (Addendum)
We have given you an injection of dexamethasone 10 mg, and a steroid.  Albuterol inhaler--do 2 puffs every 4 hours as needed for shortness of breath or wheezing  Take benzonatate 100 mg, 1 tab every 8 hours as needed for cough.  You can go to [facility] health med center at [facility] on [address]; they are open [phone] today or you can return here on Monday, January 20 for a chest x-ray.  Take doxycycline 100 mg --1 capsule 2 times daily for 7 days (Stop taking cefdinir)

## 2023-06-09 NOTE — ED Triage Notes (Signed)
Started on Cefdinir 8 days ago for Sinus infection. Reports symptoms still persist and now believes infection is settling in her chest. Productive cough and chest congestion.

## 2023-06-09 NOTE — ED Provider Notes (Signed)
[facility]    CSN: [id number] Arrival date & time: 06/09/23  1021      History   Chief Complaint Chief Complaint  Patient presents with   URI    Entered by patient    HPI Amy Carney is a 48 y.o. female.    URI Here for cough and chest congestion.  About 8 days ago she was begun on cefdinir for what seemed to be a sinus infection.  She had been sick for about 3 weeks at that point.  At that time it was mostly sinus pressure and sinus drainage.  She has not had fever at any point  No vomiting or diarrhea  About 3 to 4 days ago she started having more chest congestion and feeling tight in her chest and she has been coughing more.  She has been around people with pneumonia and other respiratory illnesses.  She is allergic to Levaquin which causes myalgia, sulfa which causes a rash and penicillin which causes a rash.  She has had an ablation so is not having any periods.  Past Medical History:  Diagnosis Date   Anemia    Anxiety    Bilateral neck pain    Chronic migraine without aura without status migrainosus, not intractable    Fatigue    Gastritis    GERD (gastroesophageal reflux disease)    Hiatal hernia    Internal hemorrhoids    Neuralgia    Recurrent major depression in remission (HCC)    Sinusitis    Skin tag of perianal region    Weakness     Patient Active Problem List   Diagnosis Date Noted   Gastroesophageal reflux disease     Past Surgical History:  Procedure Laterality Date   ANAL SPHINCTEROTOMY     BREAST BIOPSY Right 03/07/2016   CESAREAN SECTION     DILATION AND CURETTAGE OF UTERUS     ENDOMETRIAL ABLATION     ESOPHAGEAL MANOMETRY N/A 01/06/2019   Procedure: ESOPHAGEAL MANOMETRY (EM);  Surgeon: Napoleon Form, MD;  Location: [facility];  Service: Endoscopy;  Laterality: N/A;   LAPAROSCOPY     NASAL SINUS SURGERY     PH IMPEDANCE STUDY N/A 01/06/2019   Procedure: PH IMPEDANCE STUDY;  Surgeon: Napoleon Form, MD;   Location: [facility];  Service: Endoscopy;  Laterality: N/A;   TONSILLECTOMY     TUBAL LIGATION      OB History   No obstetric history on file.      Home Medications    Prior to Admission medications   Medication Sig Start Date End Date Taking? Authorizing Provider  albuterol (VENTOLIN HFA) 108 (90 Base) MCG/ACT inhaler Inhale 2 puffs into the lungs every 4 (four) hours as needed for wheezing or shortness of breath. 06/09/23  Yes Dangelo Guzzetta, Janace Aris, MD  benzonatate (TESSALON) 100 MG capsule Take 1 capsule (100 mg total) by mouth 3 (three) times daily as needed for cough. 06/09/23  Yes Zymier Rodgers, Janace Aris, MD  doxycycline (VIBRAMYCIN) 100 MG capsule Take 1 capsule (100 mg total) by mouth 2 (two) times daily for 7 days. 06/09/23 06/16/23 Yes Zenia Resides, MD  DULoxetine (CYMBALTA) 30 MG capsule Take 1 capsule (30 mg total) by mouth daily. 05/17/23   Pyrtle, Carie Caddy, MD  amitriptyline (ELAVIL) 50 MG tablet Take 2 tablets (100 mg total) by mouth at bedtime. 03/26/23   Pyrtle, Carie Caddy, MD  busPIRone (BUSPAR) 5 MG tablet Take by mouth. 05/13/20  [provider]  dexlansoprazole (DEXILANT) 60 MG capsule Take 1 capsule (60 mg total) by mouth daily. 05/31/23   Pyrtle, Carie Caddy, MD  methocarbamol (ROBAXIN) 500 MG tablet Take 500 mg by mouth daily.    [provider]  metoCLOPramide (REGLAN) 10 MG tablet Take 1 tablet (10 mg total) by mouth 2 (two) times daily as needed. 10/09/17   Pyrtle, Carie Caddy, MD  VITAMIN B1-B12 IM Inject into the muscle every 14 (fourteen) days.    [provider]    Family History Family History  Problem Relation Age of Onset   Colon cancer Other        Great Maternal Grandmother   Barrett's esophagus Paternal Grandmother    Osteoporosis Paternal Grandmother    Migraines Mother    Skin cancer Mother    Hypertension Maternal Grandmother    Lymphoma Maternal Grandfather    Stroke Paternal Grandfather    Stomach cancer Neg Hx    Pancreatic cancer  Neg Hx     Social History Social History   Tobacco Use   Smoking status: Former    Current packs/day: 0.00    Types: Cigarettes    Start date: 1998    Quit date: 2003    Years since quitting: 22.0   Smokeless tobacco: Never  Vaping Use   Vaping status: Never Used  Substance Use Topics   Alcohol use: Yes    Alcohol/week: 0.0 standard drinks of alcohol    Comment: Socially   Drug use: No     Allergies   Levaquin  [levofloxacin in d5w], Topamax [topiramate], Penicillins, and Sulfa antibiotics   Review of Systems Review of Systems   Physical Exam Triage Vital Signs ED Triage Vitals  Encounter Vitals Group     BP 06/09/23 1033 122/74     Systolic BP Percentile --      Diastolic BP Percentile --      Pulse Rate 06/09/23 1033 83     Resp 06/09/23 1033 20     Temp 06/09/23 1033 98.7 F (37.1 C)     Temp Source 06/09/23 1033 Oral     SpO2 06/09/23 1033 95 %     Weight 06/09/23 1036 190 lb (86.2 kg)     Height --      Head Circumference --      Peak Flow --      Pain Score 06/09/23 1035 2     Pain Loc --      Pain Education --      Exclude from Growth Chart --    No data found.  Updated Vital Signs BP 122/74 (BP Location: Right Arm)   Pulse 83   Temp 98.7 F (37.1 C) (Oral)   Resp 20   Wt 86.2 kg   SpO2 95%   BMI 25.07 kg/m   Visual Acuity Right Eye Distance:   Left Eye Distance:   Bilateral Distance:    Right Eye Near:   Left Eye Near:    Bilateral Near:     Physical Exam Vitals reviewed.  Constitutional:      General: She is not in acute distress.    Appearance: She is not toxic-appearing.  HENT:     Right Ear: Tympanic membrane and ear canal normal.     Left Ear: Tympanic membrane and ear canal normal.     Nose: Nose normal.     Mouth/Throat:     Mouth: Mucous membranes are moist.  Pharynx: No oropharyngeal exudate or posterior oropharyngeal erythema.  Eyes:     Extraocular Movements: Extraocular movements intact.      Conjunctiva/sclera: Conjunctivae normal.     Pupils: Pupils are equal, round, and reactive to light.  Cardiovascular:     Rate and Rhythm: Normal rate and regular rhythm.     Heart sounds: No murmur heard. Pulmonary:     Effort: No respiratory distress.     Breath sounds: No stridor. No rhonchi or rales.     Comments: I do not hear any high-pitched wheezes but her breath sounds are coarse and her expiratory phase is prolonged.  No rhonchi or rales. Musculoskeletal:     Cervical back: Neck supple.  Lymphadenopathy:     Cervical: No cervical adenopathy.  Skin:    Capillary Refill: Capillary refill takes less than 2 seconds.     Coloration: Skin is not jaundiced or pale.  Neurological:     General: No focal deficit present.     Mental Status: She is alert and oriented to person, place, and time.  Psychiatric:        Behavior: Behavior normal.      UC Treatments / Results  Labs (all labs ordered are listed, but only abnormal results are displayed) Labs Reviewed  POC COVID19/FLU A&B COMBO - Normal    EKG   Radiology No results found.  Procedures Procedures (including critical care time)  Medications Ordered in UC Medications  dexamethasone (DECADRON) injection 10 mg (has no administration in time range)    Initial Impression / Assessment and Plan / UC Course  I have reviewed the triage vital signs and the nursing notes.  Pertinent labs & imaging results that were available during my care of the patient were reviewed by me and considered in my medical decision making (see chart for details).     Flu and COVID test is negative  I think she is having an asthma exacerbation.  Albuterol is sent into the pharmacy and she is given an injection of Decadron here.  I am changing her antibiotic to doxycycline in case this were to be mycoplasma.  Chest x-ray is ordered but we do not have x-ray in the building yet she will either go to another location today or come back on  Monday, January 20 when we do not have x-ray . Final Clinical Impressions(s) / UC Diagnoses   Final diagnoses:  Mild intermittent asthma with acute exacerbation     Discharge Instructions      We have given you an injection of dexamethasone 10 mg, and a steroid.  Albuterol inhaler--do 2 puffs every 4 hours as needed for shortness of breath or wheezing  Take benzonatate 100 mg, 1 tab every 8 hours as needed for cough.  You can go to [facility] health med center at [facility] on [address]; they are open [phone] today or you can return here on Monday, January 20 for a chest x-ray.  Take doxycycline 100 mg --1 capsule 2 times daily for 7 days (Stop taking cefdinir)     ED Prescriptions     Medication Sig Dispense Auth. Provider   benzonatate (TESSALON) 100 MG capsule Take 1 capsule (100 mg total) by mouth 3 (three) times daily as needed for cough. 21 capsule Zenia Resides, MD   doxycycline (VIBRAMYCIN) 100 MG capsule Take 1 capsule (100 mg total) by mouth 2 (two) times daily for 7 days. 14 capsule Zenia Resides, MD  albuterol (VENTOLIN HFA) 108 (90 Base) MCG/ACT inhaler Inhale 2 puffs into the lungs every 4 (four) hours as needed for wheezing or shortness of breath. 1 each Zenia Resides, MD      PDMP not reviewed this encounter.   Zenia Resides, MD 06/09/23 [phone]

## 2023-06-26 ENCOUNTER — Telehealth: Payer: Self-pay | Admitting: Internal Medicine

## 2023-06-26 NOTE — Telephone Encounter (Signed)
Patient wishing to schedule ov for yearly evaluation. At the moment no additional openings on my end. Patient denied being scheduled with PA prefers to see provider. Please advise.   Thank you

## 2023-06-26 NOTE — Telephone Encounter (Signed)
Patient advised. Thank you.

## 2023-06-26 NOTE — Telephone Encounter (Signed)
Pt scheduled to see Dr. Rhea Belton 08/22/23 at 9:30am. Please notify pt of appt.

## 2023-08-04 ENCOUNTER — Encounter: Payer: Self-pay | Admitting: Internal Medicine

## 2023-08-06 MED ORDER — DEXLANSOPRAZOLE 60 MG PO CPDR: 60.0000 mg | DELAYED_RELEASE_CAPSULE | Freq: Every day | ORAL | 0 refills | Status: DC

## 2023-08-22 ENCOUNTER — Telehealth: Payer: Self-pay

## 2023-08-22 ENCOUNTER — Ambulatory Visit: Payer: Commercial Managed Care - PPO | Admitting: Internal Medicine

## 2023-08-22 ENCOUNTER — Encounter: Payer: Self-pay | Admitting: Internal Medicine

## 2023-08-22 ENCOUNTER — Other Ambulatory Visit (HOSPITAL_COMMUNITY): Payer: Self-pay

## 2023-08-22 VITALS — BP 102/70 | HR 81 | Ht 72.0 in | Wt 197.2 lb

## 2023-08-22 DIAGNOSIS — R12 Heartburn: Secondary | ICD-10-CM | POA: Diagnosis not present

## 2023-08-22 DIAGNOSIS — K2289 Other specified disease of esophagus: Secondary | ICD-10-CM

## 2023-08-22 DIAGNOSIS — Z713 Dietary counseling and surveillance: Secondary | ICD-10-CM

## 2023-08-22 DIAGNOSIS — E538 Deficiency of other specified B group vitamins: Secondary | ICD-10-CM

## 2023-08-22 DIAGNOSIS — K219 Gastro-esophageal reflux disease without esophagitis: Secondary | ICD-10-CM

## 2023-08-22 DIAGNOSIS — R1310 Dysphagia, unspecified: Secondary | ICD-10-CM | POA: Diagnosis not present

## 2023-08-22 MED ORDER — DEXLANSOPRAZOLE 60 MG PO CPDR: 60.0000 mg | DELAYED_RELEASE_CAPSULE | Freq: Every day | ORAL | 11 refills | Status: DC

## 2023-08-22 MED ORDER — AMITRIPTYLINE HCL 50 MG PO TABS: ORAL_TABLET | ORAL | 1 refills | Status: DC

## 2023-08-22 NOTE — Progress Notes (Signed)
 Subjective:    Patient ID: Amy Carney, female    DOB: 1976-04-24, 48 y.o.   MRN: [id number]  HPI Amy Carney is a 48 year old female with a history of GERD (heartburn, globus sensation and throat clearing) with esophageal hypersensitivity, dyspepsia with epigastric pressure, history of small internal hemorrhoids who is here for follow-up.  She was last seen on 06/02/2022.  She is here alone today.   GERD evaluation: -Last EGD:  11/2018 -Barium esophagram: None -Esophageal Manometry:  12/2018: Normal -pH/Impedance:  12/2018: Normal, JD 2.3, +SAP with heartburn (but negative SI at 40%) c/w esophageal hypersensitivity   Endoscopic history: - EGD (2017): Mild gastritis, small HH - EGD (11/2018, Dr. Barron Alvine): Normal esophagus, Hill grade 2 valve with <1 cm sliding HH, mild gastritis --Colonoscopy (2017): Perianal skin tag, small internal hemorrhoids.  Normal colonoscopy to the cecum with excellent prep.  She experiences mild dysphagia, particularly with foods like breads and meats, describing a sensation of food feeling 'stuck' in her throat. This occurs a couple of times a week and requires her to drink something or eat more to help it pass. The sensation is not immediate upon swallowing but occurs later in the process. She is mindful of chewing well to manage this symptom. No fever, but she experienced chills during a recent illness.  She is currently taking Dexilant 60 mg daily for GERD and functional heartburn, which is effective as long as taken regularly. She occasionally skips a dose but is reminded by her symptoms to take it. She has tried other medications in the past, but Dexilant has been the most effective.  She had been on amitriptyline, which she finds is more effective than Cymbalta.  We had discontinued amitriptyline in favor of Cymbalta but she wishes to go back to amitriptyline.  This worked better for her functional heartburn symptoms but also aided with her sleep.  She did  not have morning hangover.  She has stopped taking Buspar and has not used Reglan in over a year. She is also taking B12 injections.  She is planning to restart phentermine for weight management and will pair it with topiramate, which she has used before for migraines  She recalls a family history of colon cancer in a great grandmother but is following a 10-year interval for colonoscopy as her last one in 2017 was normal.   Review of Systems As per HPI, otherwise negative  Current Medications, Allergies, Past Medical History, Past Surgical History, Family History and Social History were reviewed in [employer] record.     Objective:   Physical Exam BP 102/70   Pulse 81   Ht 6' (1.829 m)   Wt 197 lb 4 oz (89.5 kg)   BMI 26.75 kg/m  Gen: awake, alert, NAD HEENT: anicteric  Neuro: nonfocal      Assessment & Plan:  GERD/Functional Heartburn with esophageal hypersensitivity Functional heartburn with burning sensation and dysphagia improved with amitriptyline and more so than with Cymbalta. Dexilant effective when taken regularly. Dysphagia may be due to reflux-related scar tissue or mild inflammation. Potential esophageal dilation discussed if symptoms persist. - Discontinue Cymbalta. - Start amitriptyline at 50 mg for one week, then increase to 100 mg at bedtime. - Continue Dexilant 60 mg daily. - Consider esophageal dilation if dysphagia worsens.  Weight Management Difficulty with weight loss. Plans to restart phentermine with topiramate. Previously experienced facial numbness with topiramate but willing to retry. Advised to stagger medication start with amitriptyline to avoid interactions. -  Restart phentermine. - Start topiramate after establishing tolerance to amitriptyline. - Ensure a 10-day washout period before endoscopic procedures.  Vitamin B12 Deficiency On B12 injections as part of management plan. - Continue B12  injections.  Follow-up Colonoscopy due in 2027. One grandparent with colon cancer does not necessitate a shortened interval for colonoscopy. - Schedule screening colonoscopy in 2027. - Monitor swallowing difficulties and report if intervention is needed.  30 minutes total spent today including patient facing time, coordination of care, reviewing medical history/procedures/pertinent radiology studies, and documentation of the encounter.

## 2023-08-22 NOTE — Telephone Encounter (Signed)
 Pharmacy Patient Advocate Encounter   Received notification from CoverMyMeds that prior authorization for Dexlansoprazole 60MG  dr capsules is required/requested.   Insurance verification completed.   The patient is insured through [employer] .   Per test claim: PA required; PA submitted to above mentioned insurance via Fax Key/confirmation #/EOC [id number] Status is pending

## 2023-08-22 NOTE — Patient Instructions (Addendum)
 Stop Cymbalta, Reglan and buspirone.   Restart amitriptyline 50 mg (1 tablet) by mouth daily x 1 week then increase to 100 mg (2 tablets) by mouth daily. A prescription has been sent to the pharmacy.  Continue Dexilant 60 mg daily and B12 injections.   Call our office If your trouble swallowing gets worse.    _______________________________________________________  If your blood pressure at your visit was 140/90 or greater, please contact your primary care physician to follow up on this.  _______________________________________________________  If you are age 47 or older, your body mass index should be between 23-30. Your Body mass index is 26.75 kg/m. If this is out of the aforementioned range listed, please consider follow up with your Primary Care Provider.  If you are age 36 or younger, your body mass index should be between 19-25. Your Body mass index is 26.75 kg/m. If this is out of the aformentioned range listed, please consider follow up with your Primary Care Provider.   ________________________________________________________  The [place] [facility] providers would like to encourage you to use [facility] to communicate with providers for non-urgent requests or questions.  Due to long hold times on the telephone, sending your provider a message by [facility] may be a faster and more efficient way to get a response.  Please allow 48 business hours for a response.  Please remember that this is for non-urgent requests.  _______________________________________________________

## 2023-08-27 NOTE — Telephone Encounter (Signed)
 Crazy for a pt on stable therapy Apparently insurance knows best  Ok to try Aciphex 20 mg BID-AC Please explain to pt why the change or trial must be done due to her insurance requirements If not as effective she can let us know and we can change back to [employer]

## 2023-08-27 NOTE — Telephone Encounter (Signed)
 Pharmacy Patient Advocate Encounter  Received notification from [facility] that Prior Authorization for Dexlansoprazole 60MG  dr capsules has been DENIED.  Full denial letter will be uploaded to the media tab. See denial reason below.  This request has not been approved because this drug is not on formulary. An exception to allow coverage of a non-formulary drug may be granted if all conditions in our Coverage Determinations-Exceptions policy are met. From the information we have received, the member does not meet number 2 of the exception policy criteria. The criteria from the policy are listed here.  1) The drug is being used for a condition approved by the [place] FDA 2) All formulary alternatives have been tried or medical reasons have been provided why all other covered drugs cannot be tried.  3) Records have been received showing the requested drug is medically necessary. These should be relevant medical history and lab results, past treatments tried with dates of trial and responses, and any other evidence to show the covered drugs are likely to be ineffective or unsafe for the member.  4) Prescription drug samples were not used to establish treatment.   PA #/Case ID/Reference #:  [id number]

## 2023-08-27 NOTE — Telephone Encounter (Signed)
 Dr Rhea Belton,  Please advise.... Insurance is requiring patient to try rabeprazole before they will consider dexilant re-approval. She has already tried all other formulary medications without relief.Marland KitchenMarland Kitchen

## 2023-08-28 NOTE — Telephone Encounter (Signed)
 Spoke with patient to advise of insurance decision and requirements to cover dexilant. Patient states that she thinks she has previously tried rabeprazole as well. States she would like to look through the records from her PCP that she has available at home and then call us back with the information. Advised patient that we will await her return call before moving forward with any insurance appeals or sending rabeprazole.

## 2023-08-30 NOTE — Telephone Encounter (Signed)
 There have not been. Patient says she would like to call us back before moving forward with appeals or change in medications.  Thank you for the follow up.

## 2023-08-30 NOTE — Telephone Encounter (Signed)
 Has there been any updates from patient in regards to try/fail medication?

## 2023-09-01 IMAGING — MR MR CERVICAL SPINE WO/W CM
7 of 9 series · 31 of 48 positions shown · IV contrast (multihance)
Comparison: Cervical spine MRI 07/10/2016

CLINICAL DATA: Neck pain radiating down the right arm with right
arm numbness for 6 months

EXAM:
MRI CERVICAL SPINE WITHOUT AND WITH CONTRAST
TECHNIQUE: Multiplanar and multiecho pulse sequences of the cervical spine, to
include the craniocervical junction and cervicothoracic junction,
were obtained without and with intravenous contrast.
CONTRAST:  15mL MULTIHANCE GADOBENATE DIMEGLUMINE 529 MG/ML IV SOLN

[Series 2: T2 · sagittal · 3.0mm · 0.82mm/px · 4 of 17 slices shown (1 of 3)]
[im 1/17]
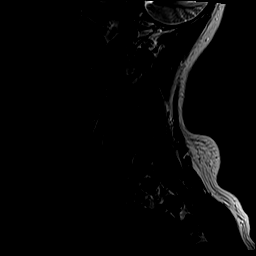
[im 6/17]
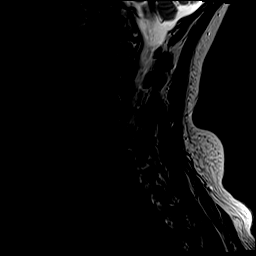
[im 11/17]
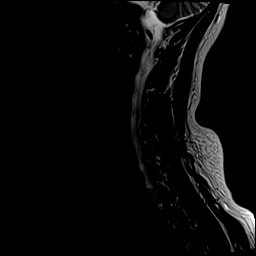
[im 17/17]
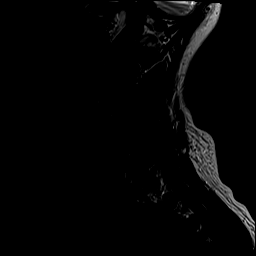

[Series 3: T1 · sagittal · 3.0mm · 0.41mm/px · 3 of 17 slices shown (1 of 2)]
[im 1/17]
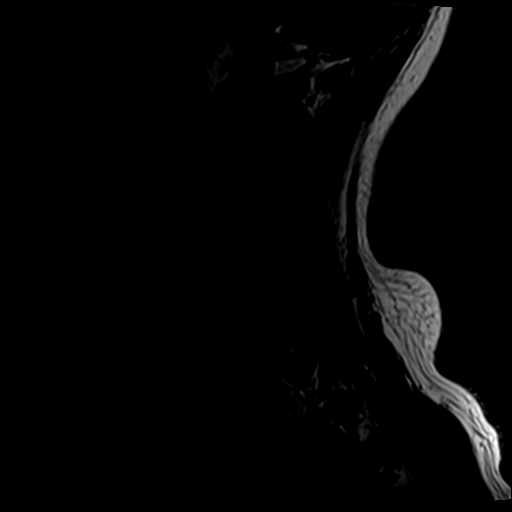
[im 9/17]
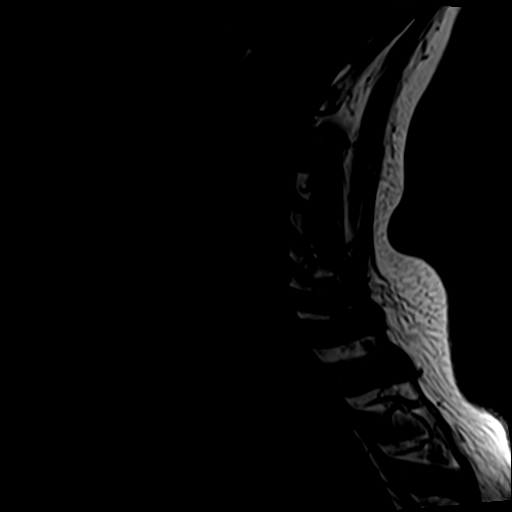
[im 17/17]
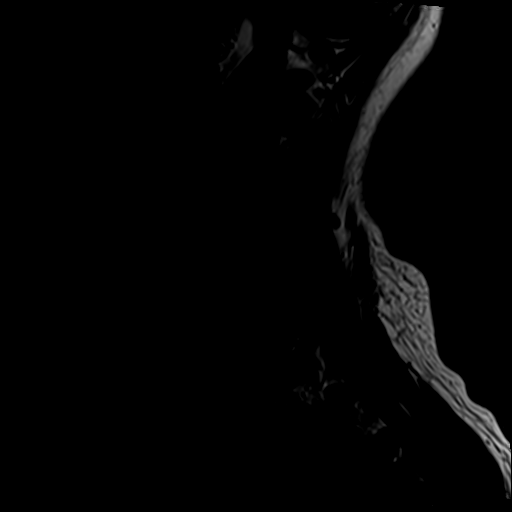

[Series 6: T2 · axial · 3.0mm · 0.78mm/px · z∈[-58,+80]mm · 8 of 38 slices shown (2 of 3)]
[im 1/38]
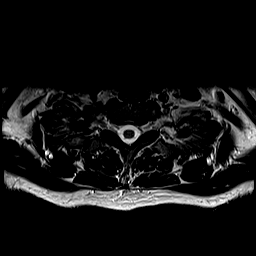
[im 6/38]
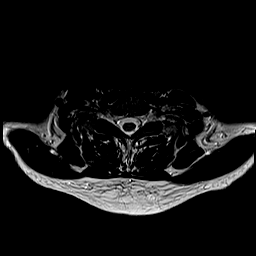
[im 11/38]
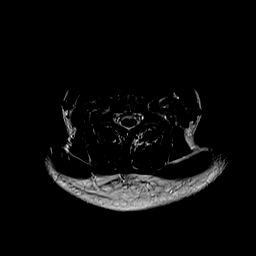
[im 16/38]
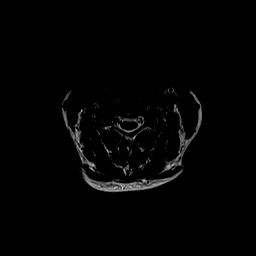
[im 22/38]
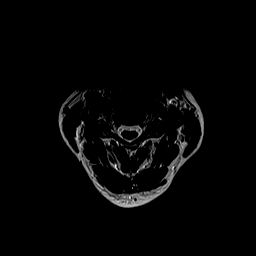
[im 27/38]
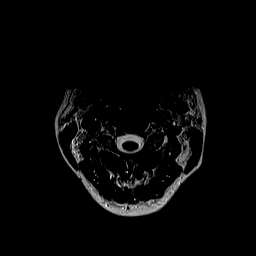
[im 32/38]
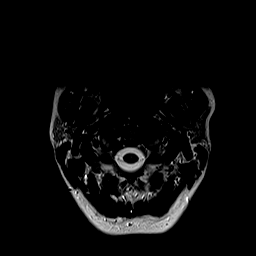
[im 38/38]
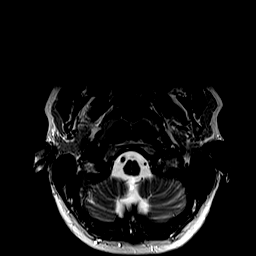

[Series 7: T1 · axial · non-contrast · 3.0mm · 0.39mm/px · z∈[-58,+80]mm · 8 of 38 slices shown (2 of 2)]
[im 1/38]
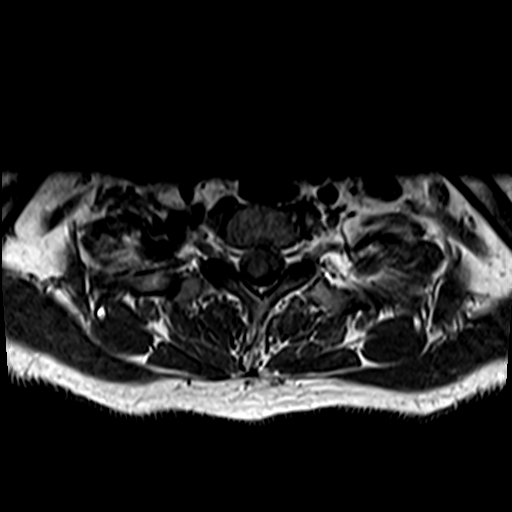
[im 6/38]
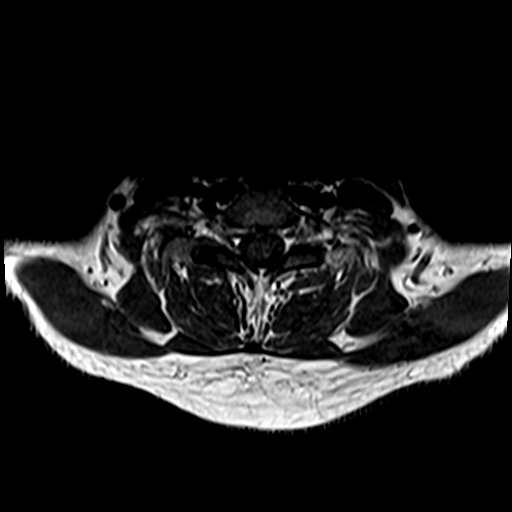
[im 11/38]
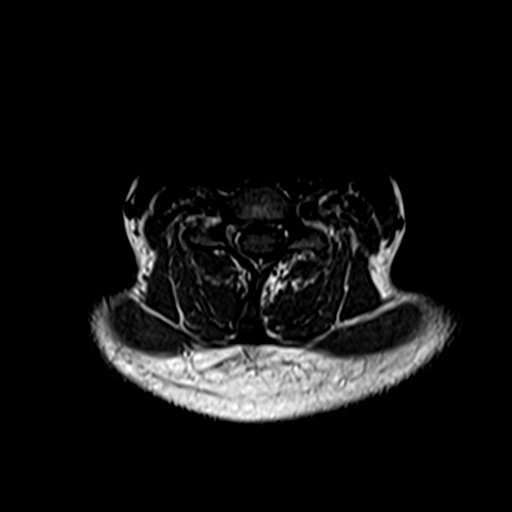
[im 16/38]
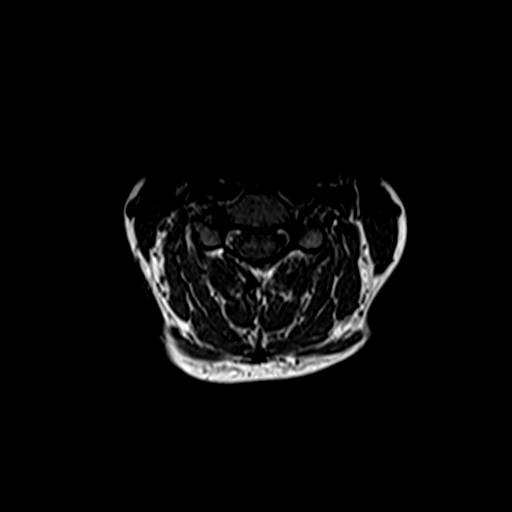
[im 22/38]
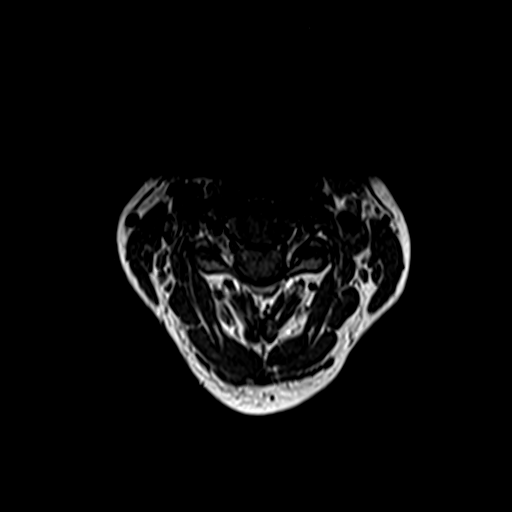
[im 27/38]
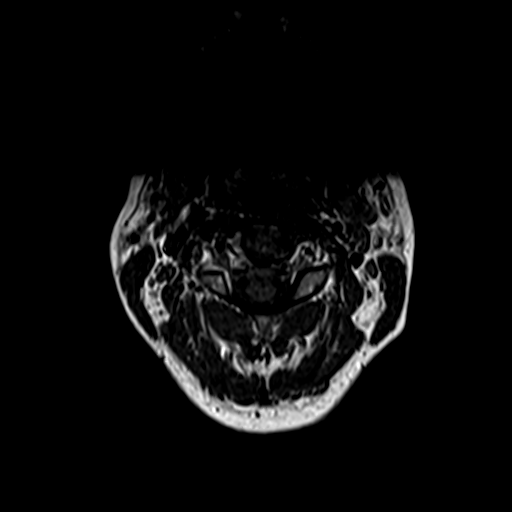
[im 32/38]
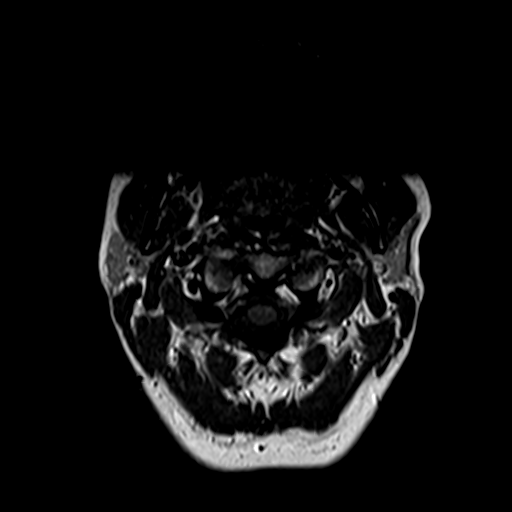
[im 38/38]
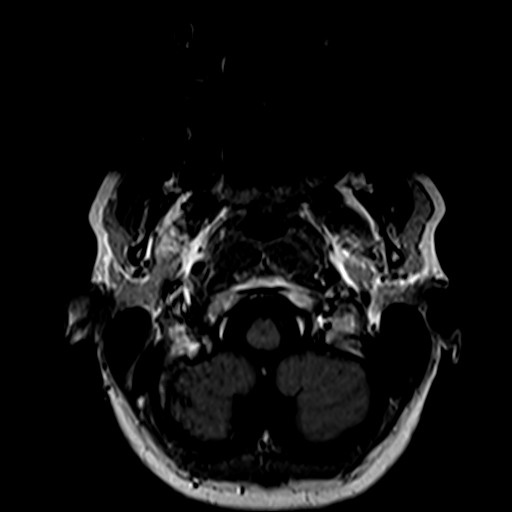

[Series 8: T1 post-contrast · axial · 3.0mm · 0.39mm/px · z∈[-58,-39]mm · 2 of 38 slices shown]
[im 1/38]
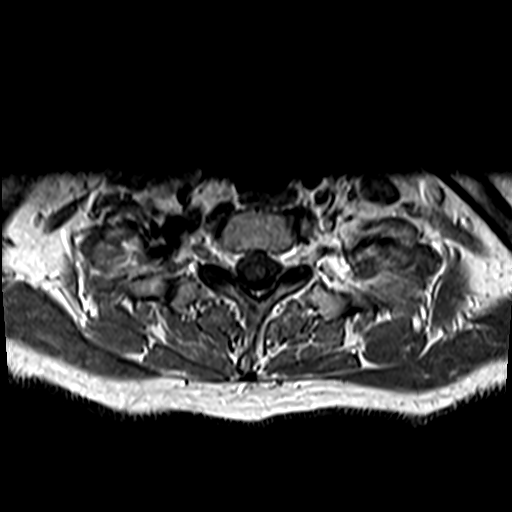
[im 6/38]
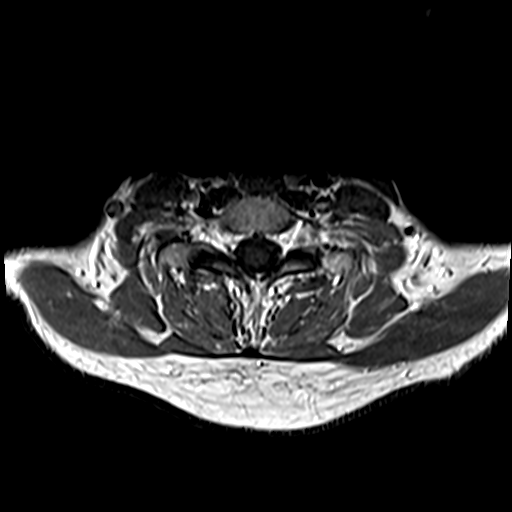

[Series 9: T1 fat-sat post-contrast · sagittal · 3.0mm · 0.82mm/px · 3 of 17 slices shown]
[im 1/17]
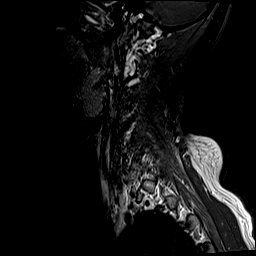
[im 9/17]
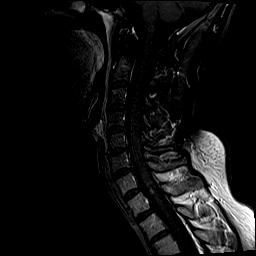
[im 17/17]
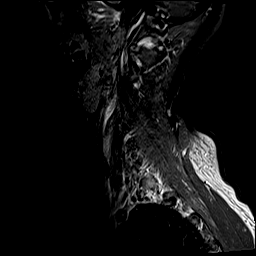

[Series 10: T2 · sagittal · 3.0mm · 0.82mm/px · 3 of 17 slices shown (3 of 3)]
[im 1/17]
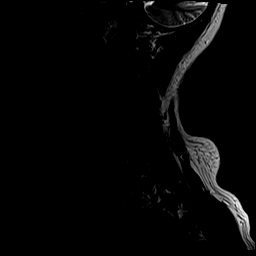
[im 9/17]
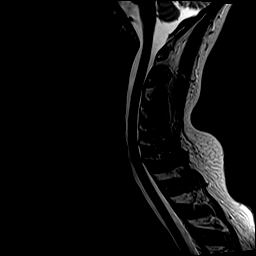
[im 17/17]
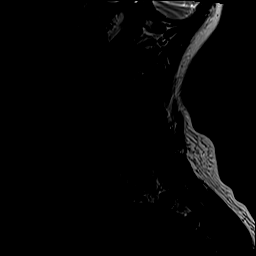

[31 of 48 positions shown; findings below may reference images not displayed]

FINDINGS: Alignment: Normal.

Vertebrae: Vertebral body heights are preserved. Marrow signal is
normal. There is no abnormal marrow edema or enhancement.

Cord: Normal in signal and morphology. There is no abnormal cord
enhancement.

Posterior Fossa, vertebral arteries, paraspinal tissues: The imaged
posterior fossa is unremarkable. The vertebral artery flow voids are
present. Paraspinal soft tissues are unremarkable.

Disc levels:

The intervertebral disc heights are overall preserved.

C2-C3: No significant spinal canal or neural foraminal stenosis.

C3-C4: No significant spinal canal or neural foraminal stenosis.

C4-C5: No significant spinal canal or neural foraminal stenosis.

C5-C6: There is mild uncovertebral and facet arthropathy resulting
in mild left and no significant right neural foraminal stenosis and
no significant spinal canal stenosis, not significantly changed.

C6-C7: There is mild right uncovertebral ridging and mild bilateral
facet arthropathy resulting in mild right and no significant left
neural foraminal stenosis and no significant spinal canal stenosis,
not significantly changed.

C7-T1: No significant spinal canal or neural foraminal stenosis.
IMPRESSION: Minimal degenerative changes at C5-C6 and C6-C7 without significant
spinal canal or neural foraminal stenosis. No evidence of nerve root
impingement to explain the patient's right-sided symptoms.

## 2023-09-06 ENCOUNTER — Encounter: Payer: Self-pay | Admitting: Internal Medicine

## 2023-09-10 MED ORDER — RABEPRAZOLE SODIUM 20 MG PO TBEC: 20.0000 mg | DELAYED_RELEASE_TABLET | Freq: Two times a day (BID) | ORAL | 1 refills | Status: DC

## 2023-10-26 ENCOUNTER — Telehealth: Payer: Self-pay

## 2023-10-26 ENCOUNTER — Other Ambulatory Visit (HOSPITAL_COMMUNITY): Payer: Self-pay

## 2023-10-26 NOTE — Telephone Encounter (Signed)
 Pharmacy Patient Advocate Encounter   Received notification from Patient Advice Request messages that prior authorization for Dexlansoprazole  60MG  dr capsules is required/requested.   Insurance verification completed.   The patient is insured through [employer] .   Per test claim: PA required; PA submitted to above mentioned insurance via CoverMyMeds Key/confirmation #/EOC [id number] Status is pending

## 2023-10-30 NOTE — Telephone Encounter (Signed)
 Good afternoon, Please give an update on PA status. Thank you.

## 2023-10-31 ENCOUNTER — Other Ambulatory Visit (HOSPITAL_COMMUNITY): Payer: Self-pay

## 2023-10-31 ENCOUNTER — Other Ambulatory Visit: Payer: Self-pay

## 2023-10-31 ENCOUNTER — Telehealth: Payer: Self-pay | Admitting: Pharmacist

## 2023-10-31 MED ORDER — DEXLANSOPRAZOLE 60 MG PO CPDR: 60.0000 mg | DELAYED_RELEASE_CAPSULE | Freq: Every day | ORAL | 3 refills | Status: DC

## 2023-10-31 NOTE — Telephone Encounter (Signed)
 Pharmacy Patient Advocate Encounter  Received notification from [facility] that Prior Authorization for Dexlansoprazole  60MG  dr capsules has been DENIED.  Full denial letter will be uploaded to the media tab. See denial reason below.  This drug is not on our list of covered drugs, also known as of formulary. Our Coverage Determinations - Exceptions policy is used to decide if a not-covered drug can be approved. The conditions in this policy have not been met. From the records that we have received, these reasons caused the denial:  1) All covered drugs used for your health issue have not been tried and failed. Other drugs that can be used are omeprazole (TRIED), pantoprazole (TRIED), rabeprazole , lansoprazole (TRIED), esomeprazole (TRIED.   PA #/Case ID/Reference #: [id number]

## 2023-10-31 NOTE — Telephone Encounter (Signed)
 I reviewed the patient note from 10/04/23, which indicates that she trialed rabeprazole  and found it ineffective. However, I was unable to locate any dispense records to support this. The insurance company will review their claims data, and if there is no record of the prescription being filled, the appeal is likely to be denied.  Does the patient have a receipt or any documentation confirming when the rabeprazole  was filled? Providing this information would significantly strengthen our appeal.  Please advise!  Thank you, Dene Fines, PharmD Clinical Pharmacist  [place]  Direct Dial: [phone]

## 2023-10-31 NOTE — Telephone Encounter (Signed)
 Spoke to patient who states that she picked up medication from [facility] when sent electronic script on 09/10/23. However, patient says the script she was given was for pantoprazole, NOT rabeprazole . I contacted [facility] to clarify what happened. After researching, [employer]) says that she has looked at the scanned image of the script we sent in for rabeprazole  on 09/10/23. She does say it appears that the prescription was filled incorrectly for the wrong medication and patient was instead given pantoprazole 20 mg.   Therefore, patient has NOT tried rabeprazole . I have asked that [facility] fill rabeprazole  for patient to try as per insurance requirements. She states they will do this.  I have spoken to patient to advise her of above information and she states she will try the rabeprazole  as requested by insurance.

## 2023-10-31 NOTE — Telephone Encounter (Signed)
 Contacted patient and informed her that her PA was denied but has been sent to the appeals team. Patient requested that a 30 day supply of Dexilant  be sent to [facility] in [place] so that she could use a goodrx card.

## 2023-11-02 ENCOUNTER — Encounter: Payer: Self-pay | Admitting: Internal Medicine

## 2023-11-15 ENCOUNTER — Other Ambulatory Visit: Payer: Self-pay | Admitting: Specialist

## 2023-11-15 DIAGNOSIS — R9230 Dense breasts, unspecified: Secondary | ICD-10-CM

## 2023-12-10 ENCOUNTER — Encounter: Payer: Self-pay | Admitting: Internal Medicine

## 2023-12-10 MED ORDER — AMITRIPTYLINE HCL 50 MG PO TABS: ORAL_TABLET | ORAL | 2 refills | Status: DC

## 2023-12-10 MED ORDER — DEXLANSOPRAZOLE 60 MG PO CPDR: 60.0000 mg | DELAYED_RELEASE_CAPSULE | Freq: Every day | ORAL | 3 refills | Status: DC

## 2023-12-10 NOTE — Telephone Encounter (Signed)
 Yes please, now can represcribe Dexilant  60 mg daily Okay to refill amitriptyline 

## 2023-12-12 ENCOUNTER — Telehealth: Payer: Self-pay

## 2023-12-12 ENCOUNTER — Other Ambulatory Visit (HOSPITAL_COMMUNITY): Payer: Self-pay

## 2023-12-12 NOTE — Telephone Encounter (Signed)
 Pharmacy Patient Advocate Encounter   Received notification from CoverMyMeds that prior authorization for Dexilant  60MG  dr capsules is required/requested.   Insurance verification completed.   The patient is insured through [employer] .   Per test claim: PA required; PA submitted to above mentioned insurance via Fax Key/confirmation #/EOC [employer] Status is pending

## 2023-12-21 ENCOUNTER — Other Ambulatory Visit (HOSPITAL_COMMUNITY): Payer: Self-pay

## 2023-12-21 NOTE — Telephone Encounter (Signed)
 Pharmacy Patient Advocate Encounter  Contacted insurance to check on status of request. They never received. Verified fax number was correct and was informed that it is. Re-faxing the form to fax [phone] [facility] [employer].

## 2023-12-25 ENCOUNTER — Other Ambulatory Visit: Payer: Self-pay | Admitting: Specialist

## 2023-12-25 ENCOUNTER — Ambulatory Visit
Admission: RE | Admit: 2023-12-25 | Discharge: 2023-12-25 | Disposition: A | Discharge status: Home / Self Care | Source: Ambulatory Visit | Attending: Specialist | Admitting: Specialist

## 2023-12-25 DIAGNOSIS — R928 Other abnormal and inconclusive findings on diagnostic imaging of breast: Secondary | ICD-10-CM

## 2023-12-25 DIAGNOSIS — R9230 Dense breasts, unspecified: Secondary | ICD-10-CM

## 2023-12-25 MED ORDER — GADOPICLENOL 0.5 MMOL/ML IV SOLN
9.0000 mL | Freq: Once | INTRAVENOUS | Status: AC | PRN
  Administered 2023-12-25: 9 mL via INTRAVENOUS

## 2023-12-26 NOTE — Telephone Encounter (Signed)
 Pharmacy Patient Advocate Encounter  Received notification from [facility] that Prior Authorization for Dexlansoprazole  60MG  dr capsules has been APPROVED from 12-26-2023 to 12-25-2024

## 2023-12-26 NOTE — Telephone Encounter (Signed)
 Informed patient that Dexilant  is approved. Patient verbalized understanding.

## 2024-01-03 ENCOUNTER — Ambulatory Visit
Admission: RE | Admit: 2024-01-03 | Discharge: 2024-01-03 | Disposition: A | Discharge status: Home / Self Care | Source: Ambulatory Visit | Attending: Specialist | Admitting: Specialist

## 2024-01-03 ENCOUNTER — Ambulatory Visit

## 2024-01-03 DIAGNOSIS — R928 Other abnormal and inconclusive findings on diagnostic imaging of breast: Secondary | ICD-10-CM

## 2024-01-03 MED ORDER — GADOPICLENOL 0.5 MMOL/ML IV SOLN
9.0000 mL | Freq: Once | INTRAVENOUS | Status: AC | PRN
  Administered 2024-01-03: 9 mL via INTRAVENOUS

## 2024-01-04 LAB — SURGICAL PATHOLOGY

## 2024-05-08 MED ORDER — DEXLANSOPRAZOLE 60 MG PO CPDR: 60.0000 mg | DELAYED_RELEASE_CAPSULE | Freq: Every day | ORAL | 3 refills | Status: AC

## 2024-06-10 ENCOUNTER — Other Ambulatory Visit: Payer: Self-pay

## 2024-06-10 MED ORDER — AMITRIPTYLINE HCL 50 MG PO TABS: ORAL_TABLET | ORAL | 2 refills | Status: AC
